# Patient Record
Sex: Female | Born: 1935 | Race: White | Hispanic: No | Smoking: Former smoker
Health system: Southern US, Community
[De-identification: ages and names within clinical notes are randomized; demographics above are authoritative.]

## PROBLEM LIST (undated history)

## (undated) DIAGNOSIS — M069 Rheumatoid arthritis, unspecified: Secondary | ICD-10-CM

## (undated) DIAGNOSIS — C801 Malignant (primary) neoplasm, unspecified: Secondary | ICD-10-CM

## (undated) DIAGNOSIS — Z87898 Personal history of other specified conditions: Secondary | ICD-10-CM

## (undated) DIAGNOSIS — K219 Gastro-esophageal reflux disease without esophagitis: Secondary | ICD-10-CM

## (undated) DIAGNOSIS — J069 Acute upper respiratory infection, unspecified: Secondary | ICD-10-CM

## (undated) DIAGNOSIS — Z5189 Encounter for other specified aftercare: Secondary | ICD-10-CM

## (undated) DIAGNOSIS — IMO0001 Reserved for inherently not codable concepts without codable children: Secondary | ICD-10-CM

## (undated) DIAGNOSIS — I10 Essential (primary) hypertension: Secondary | ICD-10-CM

## (undated) DIAGNOSIS — N3010 Interstitial cystitis (chronic) without hematuria: Secondary | ICD-10-CM

## (undated) DIAGNOSIS — R569 Unspecified convulsions: Secondary | ICD-10-CM

## (undated) DIAGNOSIS — K589 Irritable bowel syndrome without diarrhea: Secondary | ICD-10-CM

## (undated) DIAGNOSIS — J302 Other seasonal allergic rhinitis: Secondary | ICD-10-CM

## (undated) HISTORY — PX: CARDIAC CATHETERIZATION: SHX172

## (undated) HISTORY — PX: COLONOSCOPY: SHX174

## (undated) HISTORY — PX: CATARACT EXTRACTION, BILATERAL: SHX1313

## (undated) HISTORY — PX: TONGUE SURGERY: SHX810

## (undated) HISTORY — PX: FOOT SURGERY: SHX648

## (undated) HISTORY — PX: APPENDECTOMY: SHX54

## (undated) HISTORY — PX: HIP PINNING: SHX1757

## (undated) HISTORY — PX: HIP ARTHROPLASTY: SHX981

## (undated) HISTORY — PX: BREAST SURGERY: SHX581

---

## 1972-11-10 HISTORY — PX: ABDOMINAL HYSTERECTOMY: SHX81

## 2011-04-09 ENCOUNTER — Encounter (HOSPITAL_COMMUNITY)
Admission: RE | Admit: 2011-04-09 | Discharge: 2011-04-09 | Disposition: A | Discharge status: Home / Self Care | Payer: Medicare Other | Source: Ambulatory Visit | Attending: Otolaryngology | Admitting: Otolaryngology

## 2011-04-09 ENCOUNTER — Other Ambulatory Visit (HOSPITAL_COMMUNITY): Payer: Self-pay | Admitting: Otolaryngology

## 2011-04-09 DIAGNOSIS — C060 Malignant neoplasm of cheek mucosa: Secondary | ICD-10-CM

## 2011-04-09 LAB — URINALYSIS, ROUTINE W REFLEX MICROSCOPIC
Bilirubin Urine: NEGATIVE
Glucose, UA: NEGATIVE mg/dL
Hgb urine dipstick: NEGATIVE
Ketones, ur: 15 mg/dL — AB
Nitrite: NEGATIVE
Protein, ur: NEGATIVE mg/dL
Specific Gravity, Urine: 1.012 (ref 1.005–1.030)
Urobilinogen, UA: 0.2 mg/dL (ref 0.0–1.0)
pH: 7.5 (ref 5.0–8.0)

## 2011-04-09 LAB — SURGICAL PCR SCREEN
MRSA, PCR: NEGATIVE
Staphylococcus aureus: NEGATIVE

## 2011-04-09 LAB — ELECTROLYTE PANEL
CO2: 29 meq/L (ref 19–32)
Chloride: 95 mEq/L — ABNORMAL LOW (ref 96–112)
Potassium: 4.1 meq/L (ref 3.5–5.1)
Sodium: 133 meq/L — ABNORMAL LOW (ref 135–145)

## 2011-04-09 LAB — CBC
HCT: 39.6 % (ref 36.0–46.0)
Hemoglobin: 13.8 g/dL (ref 12.0–15.0)
MCH: 32.1 pg (ref 26.0–34.0)
MCHC: 34.8 g/dL (ref 30.0–36.0)
MCV: 92.1 fL (ref 78.0–100.0)
Platelets: 316 10*3/uL (ref 150–400)
RBC: 4.3 MIL/uL (ref 3.87–5.11)
RDW: 13.8 % (ref 11.5–15.5)
WBC: 6.9 10*3/uL (ref 4.0–10.5)

## 2011-04-09 LAB — URINE MICROSCOPIC-ADD ON

## 2011-04-10 ENCOUNTER — Ambulatory Visit (HOSPITAL_COMMUNITY)
Admission: RE | Admit: 2011-04-10 | Discharge: 2011-04-10 | Disposition: A | Discharge status: Home / Self Care | Payer: Medicare Other | Source: Ambulatory Visit | Attending: Otolaryngology | Admitting: Otolaryngology

## 2011-04-10 ENCOUNTER — Other Ambulatory Visit (HOSPITAL_COMMUNITY): Payer: Self-pay | Admitting: Otolaryngology

## 2011-04-10 DIAGNOSIS — Z01818 Encounter for other preprocedural examination: Secondary | ICD-10-CM | POA: Insufficient documentation

## 2011-04-10 DIAGNOSIS — M47812 Spondylosis without myelopathy or radiculopathy, cervical region: Secondary | ICD-10-CM | POA: Insufficient documentation

## 2011-04-10 DIAGNOSIS — M5020 Other cervical disc displacement, unspecified cervical region: Secondary | ICD-10-CM | POA: Insufficient documentation

## 2011-04-10 LAB — BASIC METABOLIC PANEL
Chloride: 94 mEq/L — ABNORMAL LOW (ref 96–112)
Potassium: 4.3 mEq/L (ref 3.5–5.1)

## 2011-04-10 LAB — BASIC METABOLIC PANEL WITH GFR
BUN: 10 mg/dL (ref 6–23)
CO2: 30 meq/L (ref 19–32)
Calcium: 9.4 mg/dL (ref 8.4–10.5)
Creatinine, Ser: 0.57 mg/dL (ref 0.4–1.2)
GFR calc Af Amer: >60 mL/min (ref >60)
GFR calc non Af Amer: >60 mL/min (ref >60)
Glucose, Bld: 85 mg/dL (ref 70–99)
Sodium: 132 meq/L — ABNORMAL LOW (ref 135–145)

## 2011-04-11 ENCOUNTER — Observation Stay (HOSPITAL_COMMUNITY)
Admission: RE | Admit: 2011-04-11 | Discharge: 2011-04-12 | Disposition: A | Discharge status: Home / Self Care | Payer: Medicare Other | Source: Ambulatory Visit | Attending: Otolaryngology | Admitting: Otolaryngology

## 2011-04-11 ENCOUNTER — Other Ambulatory Visit: Payer: Self-pay | Admitting: Otolaryngology

## 2011-04-11 DIAGNOSIS — C029 Malignant neoplasm of tongue, unspecified: Principal | ICD-10-CM | POA: Insufficient documentation

## 2011-04-11 DIAGNOSIS — C069 Malignant neoplasm of mouth, unspecified: Secondary | ICD-10-CM | POA: Insufficient documentation

## 2011-04-11 DIAGNOSIS — I10 Essential (primary) hypertension: Secondary | ICD-10-CM | POA: Insufficient documentation

## 2011-04-11 DIAGNOSIS — M069 Rheumatoid arthritis, unspecified: Secondary | ICD-10-CM | POA: Insufficient documentation

## 2011-04-17 NOTE — Discharge Summary (Signed)
NAMEBRANDY, [name]              ACCOUNT NO.:  [id number]  MEDICAL RECORD NO.:  [id number]           PATIENT TYPE:  O  LOCATION:  5156                         FACILITY:  [facility]  PHYSICIAN:  Hermelinda Medicus, M.D.   DATE OF BIRTH:  09/12/1931  DATE OF ADMISSION:  04/11/2011 DATE OF DISCHARGE:                              DISCHARGE SUMMARY   HISTORY OF PRESENT ILLNESS:  This patient is a 77 year old female who has had previous surgery at [facility] where she has had a malignancy of her tongue resected.  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 Manson Passey for reevaluation of her right tongue and buccal mucous membrane which was a large angry looking leukoplakia.  The area not only appeared to be leukoplakic but also had a very heavy erythematous area on the right buccal membrane.  She has dental scars with her history where she had surgery apparently on 2003, 2005, and 2008, and she now enters for resection of that buccal mucous membrane with frozen section and also a biopsy excision of her tongue area considering her past history.  PAST HISTORY:  Is also one of allergy to DILANTIN and DOXYCYCLINE.  She also has had a hysterectomy, has had hip surgery for fracture in 1990, breast lumpectomy, right foot surgery.  She has had pins in her toes. She has rheumatoid arthritis long-term care in 1998 and she had an ORIF of her hip.  She has had cataracts removed.  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  Cervical spine evaluation was noted to be mildly unstable. Anesthesia felt that a glide scope could be used if she needed intubation.  SOCIAL HISTORY:  She never smoked, drink occasionally, has some dental work that she has done.  CARDIOVASCULAR HISTORY:   Is showing some hypertension that she is under control.  Stress test was done way back in 2000 and she was found to be in good status but her EKG showed no history of any myocardial infarction.  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  She also has urinary tract infection, hiatal hernia and is on the rheumatoid arthritis Imuran half a tablet 15 mg tablet and also Humira 40 mg subcutaneously every 2 weeks.  PHYSICAL EXAMINATION:  VITAL SIGNS:  A blood pressure 123/82, pulse 85, respirations 18, oxygen under room air 95.  She is only 4 feet 6 inches tall, her weight is 45 kg. HEENT:  Tympanic membranes looked excellent and move well.  Her oral cavity shows this large 2 x 3 buccal membrane on the right side that is very angry appearing and also appears to be an angry looking leukoplakia, possible carcinoma in situ, possible squamous cell carcinoma, invasive.  She also had had some dental work done and does have a mixture of gold and silver in some quite traumatic teeth within her  mouth.  She also has had considerable scarring of her tongue but she was operated on at [facility]  Larynx is clear to false cords. Epiglottis and base of tongue are clear.  The true cord mobility, gag reflex, tongue mobility, EOMs, facial nerve are all symmetrical. NECK:  Free of any thyromegaly, cervical adenopathy or mass. BACK:  CAT scan done showed the unstable cervical spine which showed no abnormal cervical adenopathy.  She also had extension and flexion films done to evaluate the stability of her cervical spine and were found to be somewhat flexible but not unstable. HEART:  A regular sinus rhythm.  No history of myocardial infarction. CHEST:  AP diameter increased but her chest x-ray showed no active disease.  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  LABORATORIES:  Quite excellent and she shows a white count of 6.9, hemoglobin 13.8, 39.6 hematocrit.  Electrolytes were looking quite excellent.  Staph aureus was negative.  MRSA was negative.  The surgery went very well under local MAC anesthesia.  We biopsy excised the right tongue and this was found to be chronic inflammation. The  biopsy excision of the entire right buccal mucous membrane was biopsied and they did not show any squamous cell carcinoma invasive. They felt it was leukoplakia though the frozen section they were not going to give any final diagnosis at that point, however, she did very well.  We closed this with 3-0 chromic catgut and the patient because of her severe rheumatoid arthritis and her unstable spine and her inability to get around accepted the walker.  She was kept on a 23-hour observation and will be discharged today under the care of her daughter. She will follow up with me in 6 days and we will check pathology on her permanent sections.          ______________________________ Hermelinda Medicus, M.D.     JC/MEDQ  D:  04/12/2011  T:  04/12/2011  Job:  [id number]  cc:   Hal T. Stoneking, M.D. Grant Ruts., D.D.S.  Electronically Signed by Hermelinda Medicus M.D. on 04/17/2011 02:23:40 PM

## 2011-04-17 NOTE — Op Note (Signed)
  [name], LEEB              ACCOUNT NO.:  [id number]  MEDICAL RECORD NO.:  [id number]  LOCATION:                                 FACILITY:  PHYSICIAN:  Hermelinda Medicus, M.D.   DATE OF BIRTH:  Mar 08, 1931  DATE OF PROCEDURE: DATE OF DISCHARGE:                              OPERATIVE REPORT   PREOPERATIVE DIAGNOSES:  Right leukoplakia buccal mucous membrane, 2 x 3 cm with history of leukoplakia, rule out squamous cell carcinoma and right resection of previously resected tongue with lesion with concern at the incisional site.  POSTOPERATIVE DIAGNOSES:  Right leukoplakia buccal mucous membrane, 2 x 3 cm with history of leukoplakia, rule out squamous cell carcinoma and right resection of previously resected tongue with lesion with concern at the incisional site.  SURGERY:  Resection of the buccal mucous membrane leukoplakia with frozen section which did not see invasive squamous cell carcinoma and the biopsy excision of the right tongue showing inflammation chronic.  OPERATOR:  Hermelinda Medicus, MD  ANESTHESIA:  Local, MAC with Dr. Jacklynn Bue, general anesthesia was avoided because of an unstable cervical spine, because of rheumatoid arthritis.  The patient is aware of the risks and gains that she is going to have to have a bland diet and be careful with this food until this buccal mucous membrane heals.  The patient under local MAC anesthesia was anesthetized with 1% Xylocaine with epinephrine and then the tongue biopsy was carried out and frozen section was reviewed showing only a chronic inflammation. The frozen section was also as stated done at the area of the buckle membrane leukoplakia squamous cell and they saw inflammation with leukoplakia, but did not see invasive squamous cell carcinoma, so therefore this entire leukoplakia was excised using the blade excision and then Bovie electrocoagulation was used for hemostasis.  This area was closed carefully with 3-0  chromic catgut interrupted sutures.  The area of resection was essentially 3 cm x 2 cm in size and this will be sent of course to Pathology for permanent section.  The patient tolerated the procedure very well.  She lost approximately 20 mL of blood, had no pain, and is doing well and because of her rheumatoid arthritis and difficulty getting around we will probably keep her as a 23-hour observation.          ______________________________ Hermelinda Medicus, M.D.     JC/MEDQ  D:  04/11/2011  T:  04/11/2011  Job:  [id number]  cc:   Grant Ruts., D.D.S. Dr. Elray Buba Hal T. Stoneking, M.D.  Electronically Signed by Hermelinda Medicus M.D. on 04/17/2011 02:23:43 PM

## 2011-04-17 NOTE — H&P (Signed)
NAME:  [name], [name]              ACCOUNT NO.:  [id number]  MEDICAL RECORD NO.:  [id number]  LOCATION:                                 FACILITY:  PHYSICIAN:  Hermelinda Medicus, M.D.   DATE OF BIRTH:  1931-08-31  DATE OF ADMISSION:  04/11/2011 DATE OF DISCHARGE:                             HISTORY & PHYSICAL   This patient is a 77 year old female who has lived in [place], [place] in the past.  She has had 3 resections of lesion, squamous cell carcinoma from her tongue on the right side.  Apparently, these have been very small because she has done extremely well.  This was done at [facility] in 2003, 2005, and 2008.  She also has been treated for a leukoplakia in her right buccal mucous membrane in 1999.  She moved here from [facility] and has had dental care and was referred to me by Dr. Gwendlyn Deutscher. She has a lesion that is approximately 3 x 2 cm in her right buccal mucous membrane which does look like leukoplakia in certain parts, but also looks like a carcinoma in situ and maybe invasive carcinoma.  She also has a small lesion in her right tongue which shows it is associated with some of the scar tissue from previous resection which is of concern.  She now enters for an excisional biopsy and frozen section and then resection of these areas based on the frozen section results.  On her past history, she is and allergic to DILANTIN and DOXYCYCLINE. She has had a hysterectomy.  She had a right hip surgery for fracture in 1990, had a breast lumpectomy, had a right foot surgery with pins in her toes.  She has rheumatoid arthritis long term in 1998.  She had the what they call precancerous lesions also removed from her tongue.  She had an ORIF of her left hip and has had cataracts removed with laser surgery apparently after that.  She also because of her rheumatoid arthritis has UNSTABLE CERVICAL SPINE, concern of intubation, is worked up with a CAT scan not only because of her lesions  which were totally negative for any abnormal nodes or spread of tumor but did show the cervical spine instability and this was checked with also flexion and extension cervical spine evaluation and found to be mildly unstable, but if necessary could using a GlideScope intubate.  Therefore, our plan is to do the surgery under local MAC.  She never smoked.  She drinks occasionally.  She has some caps and crowns.  Her cardiovascular history, she does have some hypertension well under control, stress test done in 2000.  She has her EKG, is regular sinus rhythm, left axis deviation.  No history of myocardial infarction.  She also had a grand mal seizure as an infant and does take medication for this, hiatal hernia. Urinary history, urinary tract infections with severe rheumatoid arthritis and she takes Imuran 1/2 tablet of 15 mg daily and also takes Humira 0.8/40 mg subcutaneously every 2 weeks.  She takes several other medications that are in the chart.  Her blood pressure is well under control at 123/82, pulse 85, respirations 18, O2 95.  She  is 4 feet and 6 inches tall.  Weight 45. Her ears are clear.  Tympanic membranes look excellent.  Her oral cavity shows this large buccal membrane leukoplakia, squamous cell carcinoma in situ possibility, 3 x 2 cm, mid buccal membrane.  She has also had some dental work in the past and does have mixed gold and silver.  She has also very sharp teeth.  She also has the tongue that is quite scarred from previous resections at [facility] and has one area of concern which will be biopsied.  Larynx is clear.  True cords, false cords, epiglottis, base of tongue, and lateral pharyngeal walls are completely clear.  True cord mobility, gag reflex, tongue mobility, EOMs, and facial nerve are all symmetrical.  Her tongue mobility is limited because of scar tissue, but there does not appear to be any neurologic issue.  Oriented x3.  Cranial nerves intact.  Neck is  somewhat unstable. She has considerable pain and instability seen on CAT scan and because of her rheumatoid arthritis.  Her chest is clear.  No rales, rhonchi, or wheezes.  Increased AP diameter, regular sinus rhythm on her cardiac evaluation with left axis deviation.  GI is within normal status.  Her legs are showing history of her arthritis as well as her hands and history of her surgery as above mentioned.  Our initial diagnosis is that of leukoplakia right buccal membrane, rule out squamous cell carcinoma in situ or invasive, history of squamous cell carcinoma of the tongue, rule out recurrent issue in the right tongue, rheumatoid arthritis, history of breast cancer, history of hip fracture, history of seizures, history of right foot surgery, and right hip fracture, history of hysterectomy.          ______________________________ Hermelinda Medicus, M.D.     JC/MEDQ  D:  04/11/2011  T:  04/11/2011  Job:  [id number]  cc:   Grant Ruts., D.D.S. Hal T. Stoneking, M.D. Dr. Elray Buba  Electronically Signed by Hermelinda Medicus M.D. on 04/17/2011 02:23:46 PM

## 2011-09-17 ENCOUNTER — Other Ambulatory Visit: Payer: Self-pay

## 2011-11-20 DIAGNOSIS — D3701 Neoplasm of uncertain behavior of lip: Secondary | ICD-10-CM | POA: Diagnosis not present

## 2011-11-20 DIAGNOSIS — K1321 Leukoplakia of oral mucosa, including tongue: Secondary | ICD-10-CM | POA: Diagnosis not present

## 2011-12-08 DIAGNOSIS — M25559 Pain in unspecified hip: Secondary | ICD-10-CM | POA: Diagnosis not present

## 2011-12-08 DIAGNOSIS — M810 Age-related osteoporosis without current pathological fracture: Secondary | ICD-10-CM | POA: Diagnosis not present

## 2011-12-08 DIAGNOSIS — M069 Rheumatoid arthritis, unspecified: Secondary | ICD-10-CM | POA: Diagnosis not present

## 2011-12-08 DIAGNOSIS — M2550 Pain in unspecified joint: Secondary | ICD-10-CM | POA: Diagnosis not present

## 2011-12-08 DIAGNOSIS — Z79899 Other long term (current) drug therapy: Secondary | ICD-10-CM | POA: Diagnosis not present

## 2011-12-11 DIAGNOSIS — S72109A Unspecified trochanteric fracture of unspecified femur, initial encounter for closed fracture: Secondary | ICD-10-CM | POA: Diagnosis not present

## 2012-01-22 DIAGNOSIS — H52209 Unspecified astigmatism, unspecified eye: Secondary | ICD-10-CM | POA: Diagnosis not present

## 2012-01-22 DIAGNOSIS — H04129 Dry eye syndrome of unspecified lacrimal gland: Secondary | ICD-10-CM | POA: Diagnosis not present

## 2012-01-22 DIAGNOSIS — Z961 Presence of intraocular lens: Secondary | ICD-10-CM | POA: Diagnosis not present

## 2012-01-22 DIAGNOSIS — H01009 Unspecified blepharitis unspecified eye, unspecified eyelid: Secondary | ICD-10-CM | POA: Diagnosis not present

## 2012-02-02 DIAGNOSIS — N3020 Other chronic cystitis without hematuria: Secondary | ICD-10-CM | POA: Diagnosis not present

## 2012-02-19 DIAGNOSIS — M2550 Pain in unspecified joint: Secondary | ICD-10-CM | POA: Diagnosis not present

## 2012-02-19 DIAGNOSIS — M069 Rheumatoid arthritis, unspecified: Secondary | ICD-10-CM | POA: Diagnosis not present

## 2012-02-19 DIAGNOSIS — Z79899 Other long term (current) drug therapy: Secondary | ICD-10-CM | POA: Diagnosis not present

## 2012-02-19 DIAGNOSIS — M810 Age-related osteoporosis without current pathological fracture: Secondary | ICD-10-CM | POA: Diagnosis not present

## 2012-02-25 DIAGNOSIS — L570 Actinic keratosis: Secondary | ICD-10-CM | POA: Diagnosis not present

## 2012-02-25 DIAGNOSIS — Z85828 Personal history of other malignant neoplasm of skin: Secondary | ICD-10-CM | POA: Diagnosis not present

## 2012-02-25 DIAGNOSIS — D485 Neoplasm of uncertain behavior of skin: Secondary | ICD-10-CM | POA: Diagnosis not present

## 2012-03-04 DIAGNOSIS — C049 Malignant neoplasm of floor of mouth, unspecified: Secondary | ICD-10-CM | POA: Diagnosis not present

## 2012-03-04 DIAGNOSIS — K140 Glossitis: Secondary | ICD-10-CM | POA: Diagnosis not present

## 2012-03-04 DIAGNOSIS — K1321 Leukoplakia of oral mucosa, including tongue: Secondary | ICD-10-CM | POA: Diagnosis not present

## 2012-03-10 ENCOUNTER — Encounter (HOSPITAL_COMMUNITY): Payer: Self-pay | Admitting: Pharmacy Technician

## 2012-03-10 DIAGNOSIS — D0000 Carcinoma in situ of oral cavity, unspecified site: Secondary | ICD-10-CM | POA: Diagnosis not present

## 2012-03-15 ENCOUNTER — Encounter (HOSPITAL_COMMUNITY)
Admission: RE | Admit: 2012-03-15 | Discharge: 2012-03-15 | Disposition: A | Discharge status: Home / Self Care | Payer: Medicare Other | Source: Ambulatory Visit | Attending: Otolaryngology | Admitting: Otolaryngology

## 2012-03-15 ENCOUNTER — Encounter (HOSPITAL_COMMUNITY): Payer: Self-pay

## 2012-03-15 DIAGNOSIS — I10 Essential (primary) hypertension: Secondary | ICD-10-CM | POA: Diagnosis not present

## 2012-03-15 DIAGNOSIS — K219 Gastro-esophageal reflux disease without esophagitis: Secondary | ICD-10-CM | POA: Diagnosis not present

## 2012-03-15 DIAGNOSIS — Z01812 Encounter for preprocedural laboratory examination: Secondary | ICD-10-CM | POA: Diagnosis not present

## 2012-03-15 DIAGNOSIS — G40802 Other epilepsy, not intractable, without status epilepticus: Secondary | ICD-10-CM | POA: Diagnosis not present

## 2012-03-15 DIAGNOSIS — C049 Malignant neoplasm of floor of mouth, unspecified: Secondary | ICD-10-CM | POA: Diagnosis not present

## 2012-03-15 HISTORY — DX: Personal history of other specified conditions: Z87.898

## 2012-03-15 HISTORY — DX: Acute upper respiratory infection, unspecified: J06.9

## 2012-03-15 HISTORY — DX: Gastro-esophageal reflux disease without esophagitis: K21.9

## 2012-03-15 HISTORY — DX: Unspecified convulsions: R56.9

## 2012-03-15 HISTORY — DX: Irritable bowel syndrome, unspecified: K58.9

## 2012-03-15 HISTORY — DX: Essential (primary) hypertension: I10

## 2012-03-15 HISTORY — DX: Rheumatoid arthritis, unspecified: M06.9

## 2012-03-15 HISTORY — DX: Malignant (primary) neoplasm, unspecified: C80.1

## 2012-03-15 LAB — DIFFERENTIAL
Basophils Absolute: 0 10*3/uL (ref 0.0–0.1)
Basophils Relative: 1 % (ref 0–1)
Eosinophils Absolute: 0.2 K/uL (ref 0.0–0.7)
Eosinophils Relative: 3 % (ref 0–5)
Lymphocytes Relative: 32 % (ref 12–46)
Lymphs Abs: 1.9 K/uL (ref 0.7–4.0)
Monocytes Absolute: 0.7 10*3/uL (ref 0.1–1.0)
Monocytes Relative: 11 % (ref 3–12)
Neutro Abs: 3.1 K/uL (ref 1.7–7.7)
Neutrophils Relative %: 53 % (ref 43–77)

## 2012-03-15 LAB — BASIC METABOLIC PANEL WITH GFR
Chloride: 96 meq/L (ref 96–112)
GFR calc non Af Amer: 81 mL/min — ABNORMAL LOW (ref >90)
Glucose, Bld: 85 mg/dL (ref 70–99)
Potassium: 4.6 meq/L (ref 3.5–5.1)
Sodium: 134 meq/L — ABNORMAL LOW (ref 135–145)

## 2012-03-15 LAB — CBC
HCT: 36.6 % (ref 36.0–46.0)
Hemoglobin: 12.7 g/dL (ref 12.0–15.0)
MCH: 31.9 pg (ref 26.0–34.0)
MCHC: 34.7 g/dL (ref 30.0–36.0)
MCV: 92 fL (ref 78.0–100.0)
Platelets: 318 K/uL (ref 150–400)
RBC: 3.98 MIL/uL (ref 3.87–5.11)
RDW: 14 % (ref 11.5–15.5)
WBC: 5.9 K/uL (ref 4.0–10.5)

## 2012-03-15 LAB — BASIC METABOLIC PANEL
BUN: 17 mg/dL (ref 6–23)
CO2: 26 mEq/L (ref 19–32)
Calcium: 9.6 mg/dL (ref 8.4–10.5)
Creatinine, Ser: 0.67 mg/dL (ref 0.50–1.10)
GFR calc Af Amer: >90 mL/min (ref >90)

## 2012-03-15 LAB — SURGICAL PCR SCREEN
MRSA, PCR: NEGATIVE
Staphylococcus aureus: NEGATIVE

## 2012-03-15 NOTE — Progress Notes (Signed)
Pt confirmed to having a stress test and cardiac cath in 2000 in [place]. Pt denied having any stents placed or having a current Cardiologist. Pt denied having a sleep study. Patients PCP is Dr. Pete Glatter at [place] [facility] and LOV was requested from them.

## 2012-03-15 NOTE — Pre-Procedure Instructions (Signed)
20 AZIZI BALLY  03/15/2012   Your procedure is scheduled on:  Wednesday Mar 17, 2012.  Report to [facility] at 0630 AM.  Call this number if you have problems the morning of surgery: [phone]   Remember:   Do not eat food:After Midnight.  May have clear liquids: up to 4 Hours before arrival until 0230 am.  Clear liquids include soda, tea, black coffee, apple or grape juice, broth.  Take these medicines the morning of surgery with A SIP OF WATER: Amlodipine (Norvac), Azathioprine (Imuran), and Phenobarbital (Luminal)   Do not wear jewelry, make-up or nail polish.  Do not wear lotions, powders, or perfumes. You may wear deodorant.  Do not shave 48 hours prior to surgery.  Do not bring valuables to the hospital.  Contacts, dentures or bridgework may not be worn into surgery.  Leave suitcase in the car. After surgery it may be brought to your room.  For patients admitted to the hospital, checkout time is 11:00 AM the day of discharge.   Patients discharged the day of surgery will not be allowed to drive home.  Name and phone number of your driver:   Special Instructions: CHG Shower Use Special Wash: 1/2 bottle night before surgery and 1/2 bottle morning of surgery.   Please read over the following fact sheets that you were given: Pain Booklet, Coughing and Deep Breathing, MRSA Information and Surgical Site Infection Prevention

## 2012-03-16 NOTE — H&P (Signed)
NAMEABRAHAM, [name]              ACCOUNT NO.:  [id number]  MEDICAL RECORD NO.:  [id number]  LOCATION:  PERIO                        FACILITY:  [facility]  PHYSICIAN:  Hermelinda Medicus, M.D.   DATE OF BIRTH:  10-24-1931  DATE OF ADMISSION:  03/10/2012 DATE OF DISCHARGE:                             HISTORY & PHYSICAL   This patient is a 78 year old female who has lived in [place] for several years.  Has had 3 resections of lesions of squamous cell carcinoma of her tongue on the right side at [facility].  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  She moved here from [place], referred from Dr. Gwendlyn Deutscher, and we removed a 3 x 2 cm buccal mucous membrane lesion as well as a squamous carcinoma in situ from her tongue on the right side.  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  She now enters for further evaluation and excision of this under local MAC anesthesia.  This is going to be done on local MAC anesthesia because she has a very fragile neck and we are concerned about her being intubated and under general endotracheal anesthesia.  She has an unstable cervical spine.  Her further past history is allergy to DILANTIN and DOXYCYCLINE.  She has gotten a hysterectomy.  She has had a right hip surgery for fracture in 1990, gotten a breast lumpectomy, right foot surgery with pins in her toes, has rheumatoid arthritis since 1998, and she has had these previous precancerous and cancerous lesions removed from the oral cavity.  She has also had ORIF of her hip and has had cataracts removed.  She does have  the rheumatoid arthritis, has an unstable cervical spine.  Concern on intubation was worked up with a CAT scan because she had no abnormal neck nodes, but this was noted the cervical spine instability.  There is a possibility of using a glide scope to intubate, but I think we can avoid this, so our surgery is to do under MAC.  She has never smoked. She has occasional drink.  She has several caps and crowns. Cardiovascular history was one of mild hypertension, under control. Stress test done in 2000.  Her EKG was regular sinus rhythm, left axis deviation.  No history of myocardial infarction.  She also had a history of a grand mal seizure as a child, but none since then.  Has urinary tract infection history.  She is on Imuran one-half tablet of 50 mg orally.  She also takes Humira 0.8 of 40 mg subcutaneous, lisinopril 10 mg once a day, phenobarbital 1 tablet twice a day, and then she is on L- carnitine, magnesium 1200 mg, selenium, vitamin E 50/400, glucosamine 500 mg, hyaluronic acid 20/60 mg orally daily, vitamin C 800, flaxseed oil,  milk thistle 175, folic acid 800, calcium 500, vitamin D3 300 units, and probiotic daily.  PHYSICAL EXAMINATION:  VITAL SIGNS:  Reveals a blood pressure of 120/80, pulse is 85, SaO2 of 95.  She is 4 feet 6 inches tall, weight is 45 kilos. HEENT:  Her ears are clear.  Tympanic membranes look excellent.  Her oral cavity shows the buccal mucous membrane area that has been repaired and has healed very nicely under my care.  Her lesion on the tongue which was biopsied on the right side.  Mid tongue was atypia only, but the basal anterior floor of the mouth was noted to have a squamous cell carcinoma, 2 x 1 cm in size.  She has no other lesions.  Her larynx is clear.  True cords, false cords, epiglottis, base of tongue, lateral pharyngeal walls are completely clear and true cord mobility, gag reflex, tongue mobility, EOMs, facial nerve is symmetrical.  She  is oriented x3. NECK:  Free of any thyromegaly, cervical adenopathy, or mass. CHEST:  Clear.  No rales, rhonchi, or wheezes. CARDIOVASCULAR:  No murmurs or gallops. ABDOMEN:  Unremarkable. EXTREMITIES:  Her legs are showing history of her arthritis as well as her hands.  She has a history of surgery as above mentioned.  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  She has an atypia of the right.  History of buccal mucous membrane atypia with an invasive, well-differentiated squamous cell carcinoma of the right tongue, under excellent control.  Further biopsy of the tongue was showing high-grade dysplasia.  Her further diagnoses are rheumatoid arthritis, history of breast cancer, history of hip fracture, history of seizures, history of right foot surgery and right hip fracture, and history of hysterectomy.          ______________________________ Hermelinda Medicus, M.D.     JC/MEDQ  D:  03/16/2012  T:  03/16/2012  Job:  [id number]  cc:   Hal T. Stoneking, M.D. Elray Buba, DDS Grant Ruts., D.D.S.

## 2012-03-17 ENCOUNTER — Encounter (HOSPITAL_COMMUNITY): Payer: Self-pay | Admitting: Anesthesiology

## 2012-03-17 ENCOUNTER — Encounter (HOSPITAL_COMMUNITY)
Admission: RE | Disposition: A | Discharge status: Home / Self Care | Payer: Self-pay | Source: Ambulatory Visit | Attending: Otolaryngology

## 2012-03-17 ENCOUNTER — Encounter (HOSPITAL_COMMUNITY): Payer: Self-pay | Admitting: *Deleted

## 2012-03-17 ENCOUNTER — Ambulatory Visit (HOSPITAL_COMMUNITY): Payer: Medicare Other | Admitting: Anesthesiology

## 2012-03-17 ENCOUNTER — Ambulatory Visit (HOSPITAL_COMMUNITY)
Admission: RE | Admit: 2012-03-17 | Discharge: 2012-03-18 | Disposition: A | Discharge status: Home / Self Care | Payer: Medicare Other | Source: Ambulatory Visit | Attending: Otolaryngology | Admitting: Otolaryngology

## 2012-03-17 DIAGNOSIS — I10 Essential (primary) hypertension: Secondary | ICD-10-CM | POA: Insufficient documentation

## 2012-03-17 DIAGNOSIS — K219 Gastro-esophageal reflux disease without esophagitis: Secondary | ICD-10-CM | POA: Insufficient documentation

## 2012-03-17 DIAGNOSIS — C049 Malignant neoplasm of floor of mouth, unspecified: Secondary | ICD-10-CM | POA: Insufficient documentation

## 2012-03-17 DIAGNOSIS — G40802 Other epilepsy, not intractable, without status epilepticus: Secondary | ICD-10-CM | POA: Insufficient documentation

## 2012-03-17 DIAGNOSIS — Z01812 Encounter for preprocedural laboratory examination: Secondary | ICD-10-CM | POA: Insufficient documentation

## 2012-03-17 DIAGNOSIS — D0000 Carcinoma in situ of oral cavity, unspecified site: Secondary | ICD-10-CM | POA: Diagnosis not present

## 2012-03-17 HISTORY — PX: SUBMANDIBULAR GLAND EXCISION: SHX2456

## 2012-03-17 SURGERY — EXCISION, SUBMANDIBULAR GLAND
Anesthesia: Monitor Anesthesia Care | Site: Mouth | Wound class: Clean Contaminated

## 2012-03-17 MED ORDER — LIDOCAINE-EPINEPHRINE 1 %-1:100000 IJ SOLN
INTRAMUSCULAR | Status: DC | PRN
  Administered 2012-03-17: 20 mL

## 2012-03-17 MED ORDER — CEFAZOLIN SODIUM 1-5 GM-% IV SOLN
INTRAVENOUS | Status: DC | PRN
  Administered 2012-03-17: 1 g via INTRAVENOUS

## 2012-03-17 MED ORDER — PHENOBARBITAL 32.4 MG PO TABS
64.8000 mg | ORAL_TABLET | Freq: Two times a day (BID) | ORAL | Status: DC
  Administered 2012-03-17 – 2012-03-18 (×2): 64.8 mg via ORAL
  Filled 2012-03-17 (×2): qty 2

## 2012-03-17 MED ORDER — AZATHIOPRINE 50 MG PO TABS
25.0000 mg | ORAL_TABLET | Freq: Every day | ORAL | Status: DC
  Administered 2012-03-18: 25 mg via ORAL
  Filled 2012-03-17 (×2): qty 1

## 2012-03-17 MED ORDER — HYDROCODONE-ACETAMINOPHEN 7.5-500 MG/15ML PO SOLN
10.0000 mL | ORAL | Status: DC | PRN
  Administered 2012-03-17 – 2012-03-18 (×4): 15 mL via ORAL
  Filled 2012-03-17 (×3): qty 15

## 2012-03-17 MED ORDER — AMLODIPINE BESYLATE 10 MG PO TABS
10.0000 mg | ORAL_TABLET | Freq: Every day | ORAL | Status: DC
  Administered 2012-03-17: 10 mg via ORAL
  Filled 2012-03-17 (×2): qty 1

## 2012-03-17 MED ORDER — PHENOBARBITAL 64.8 MG PO TABS: 64.8000 mg | ORAL_TABLET | Freq: Two times a day (BID) | ORAL | Status: DC

## 2012-03-17 MED ORDER — PHENOBARBITAL 64.8 MG PO TABS
64.8000 mg | ORAL_TABLET | ORAL | Status: AC
  Administered 2012-03-17: 64.8 mg via ORAL

## 2012-03-17 MED ORDER — ACETAMINOPHEN 650 MG RE SUPP: 650.0000 mg | RECTAL | Status: DC | PRN

## 2012-03-17 MED ORDER — FENTANYL CITRATE 0.05 MG/ML IJ SOLN
INTRAMUSCULAR | Status: DC | PRN
  Administered 2012-03-17: 50 ug via INTRAVENOUS

## 2012-03-17 MED ORDER — 0.9 % SODIUM CHLORIDE (POUR BTL) OPTIME
TOPICAL | Status: DC | PRN
  Administered 2012-03-17: 1000 mL

## 2012-03-17 MED ORDER — HYDROMORPHONE HCL PF 1 MG/ML IJ SOLN: 0.2500 mg | INTRAMUSCULAR | Status: DC | PRN

## 2012-03-17 MED ORDER — CEFAZOLIN SODIUM 1-5 GM-% IV SOLN
INTRAVENOUS | Status: AC
  Filled 2012-03-17: qty 50

## 2012-03-17 MED ORDER — LISINOPRIL 5 MG PO TABS
5.0000 mg | ORAL_TABLET | Freq: Every day | ORAL | Status: DC
Start: 2012-03-17 — End: 2012-03-18
  Administered 2012-03-17: 5 mg via ORAL
  Filled 2012-03-17 (×2): qty 1

## 2012-03-17 MED ORDER — ASPIRIN EC 81 MG PO TBEC
81.0000 mg | DELAYED_RELEASE_TABLET | Freq: Every day | ORAL | Status: DC
  Filled 2012-03-17: qty 1

## 2012-03-17 MED ORDER — LACTATED RINGERS IV SOLN: INTRAVENOUS | Status: DC

## 2012-03-17 MED ORDER — MORPHINE SULFATE 4 MG/ML IJ SOLN: 0.0500 mg/kg | INTRAMUSCULAR | Status: DC | PRN

## 2012-03-17 MED ORDER — ACETAMINOPHEN 160 MG/5ML PO SOLN: 650.0000 mg | ORAL | Status: DC | PRN

## 2012-03-17 MED ORDER — LACTATED RINGERS IV SOLN
INTRAVENOUS | Status: DC | PRN
  Administered 2012-03-17: 08:00:00 via INTRAVENOUS

## 2012-03-17 MED ORDER — DEXTROSE 5 % IV SOLN
INTRAVENOUS | Status: DC | PRN
  Administered 2012-03-17: 09:00:00 via INTRAVENOUS

## 2012-03-17 MED ORDER — MIDAZOLAM HCL 5 MG/5ML IJ SOLN
INTRAMUSCULAR | Status: DC | PRN
  Administered 2012-03-17: 2 mg via INTRAVENOUS

## 2012-03-17 MED ORDER — NITROFURANTOIN MACROCRYSTAL 50 MG PO CAPS
50.0000 mg | ORAL_CAPSULE | Freq: Every day | ORAL | Status: DC
  Administered 2012-03-17: 50 mg via ORAL
  Filled 2012-03-17 (×2): qty 1

## 2012-03-17 SURGICAL SUPPLY — 43 items
CANISTER SUCTION 2500CC (MISCELLANEOUS) ×2 IMPLANT
CLEANER TIP ELECTROSURG 2X2 (MISCELLANEOUS) ×2 IMPLANT
CLOTH BEACON ORANGE TIMEOUT ST (SAFETY) ×2 IMPLANT
CONT SPEC 4OZ CLIKSEAL STRL BL (MISCELLANEOUS) ×4 IMPLANT
CORDS BIPOLAR (ELECTRODE) IMPLANT
COVER SURGICAL LIGHT HANDLE (MISCELLANEOUS) ×2 IMPLANT
DRAIN SNY 7 FPER (WOUND CARE) IMPLANT
ELECT COATED BLADE 2.86 ST (ELECTRODE) ×2 IMPLANT
ELECT REM PT RETURN 9FT ADLT (ELECTROSURGICAL) ×2
ELECTRODE REM PT RTRN 9FT ADLT (ELECTROSURGICAL) ×1 IMPLANT
EVACUATOR SILICONE 100CC (DRAIN) IMPLANT
GAUZE SPONGE 4X4 16PLY XRAY LF (GAUZE/BANDAGES/DRESSINGS) IMPLANT
GLOVE BIOGEL PI IND STRL 6.5 (GLOVE) ×2 IMPLANT
GLOVE BIOGEL PI INDICATOR 6.5 (GLOVE) ×2
GLOVE SURG SS PI 7.5 STRL IVOR (GLOVE) ×2 IMPLANT
GOWN PREVENTION PLUS XLARGE (GOWN DISPOSABLE) ×2 IMPLANT
GOWN STRL NON-REIN LRG LVL3 (GOWN DISPOSABLE) ×4 IMPLANT
KIT BASIN OR (CUSTOM PROCEDURE TRAY) ×2 IMPLANT
KIT ROOM TURNOVER OR (KITS) ×2 IMPLANT
LOCATOR NERVE 3 VOLT (DISPOSABLE) IMPLANT
NS IRRIG 1000ML POUR BTL (IV SOLUTION) ×2 IMPLANT
PAD ARMBOARD 7.5X6 YLW CONV (MISCELLANEOUS) ×2 IMPLANT
PENCIL BUTTON HOLSTER BLD 10FT (ELECTRODE) ×2 IMPLANT
SPECIMEN JAR MEDIUM (MISCELLANEOUS) ×2 IMPLANT
SPONGE INTESTINAL PEANUT (DISPOSABLE) IMPLANT
STRIP CLOSURE SKIN 1/2X4 (GAUZE/BANDAGES/DRESSINGS) IMPLANT
SUT CHROMIC 3 0 SH 27 (SUTURE) ×2 IMPLANT
SUT CHROMIC 5 0 P 3 (SUTURE) ×2 IMPLANT
SUT ETHILON 5 0 P 3 18 (SUTURE)
SUT NYLON ETHILON 5-0 P-3 1X18 (SUTURE) IMPLANT
SUT SILK 2 0 (SUTURE) ×1
SUT SILK 2 0 FS (SUTURE) ×2 IMPLANT
SUT SILK 2 0 SH CR/8 (SUTURE) IMPLANT
SUT SILK 2-0 18XBRD TIE 12 (SUTURE) ×1 IMPLANT
SUT SILK 3 0 (SUTURE)
SUT SILK 3-0 18XBRD TIE 12 (SUTURE) IMPLANT
SUT SILK 4 0 (SUTURE)
SUT SILK 4-0 18XBRD TIE 12 (SUTURE) IMPLANT
SYR CONTROL 10ML LL (SYRINGE) ×2 IMPLANT
TOWEL OR 17X24 6PK STRL BLUE (TOWEL DISPOSABLE) ×2 IMPLANT
TOWEL OR 17X26 10 PK STRL BLUE (TOWEL DISPOSABLE) ×2 IMPLANT
TRAY ENT MC OR (CUSTOM PROCEDURE TRAY) ×2 IMPLANT
WATER STERILE IRR 1000ML POUR (IV SOLUTION) IMPLANT

## 2012-03-17 NOTE — Anesthesia Procedure Notes (Signed)
Procedure Name: MAC Date/Time: 03/17/2012 8:42 AM Performed by: Nicholos Johns Pre-anesthesia Checklist: Patient identified, Emergency Drugs available, Suction available, Patient being monitored and Timeout performed Patient Re-evaluated:Patient Re-evaluated prior to inductionOxygen Delivery Method: Nasal cannula

## 2012-03-17 NOTE — Preoperative (Signed)
Beta Blockers   Reason not to administer Beta Blockers:Not Applicable 

## 2012-03-17 NOTE — Anesthesia Preprocedure Evaluation (Addendum)
Anesthesia Evaluation  Patient identified by MRN, date of birth, ID band Patient awake    Reviewed: Allergy & Precautions, H&P , NPO status , Patient's Chart, lab work & pertinent test results  History of Anesthesia Complications (+) DIFFICULT AIRWAY and AWARENESS UNDER ANESTHESIA  Airway Mallampati: III TM Distance: <3 FB   Mouth opening: Limited Mouth Opening  Dental  (+) Teeth Intact and Dental Advisory Given   Pulmonary Recent URI ,  breath sounds clear to auscultation        Cardiovascular hypertension, Pt. on medications Rhythm:Regular Rate:Normal     Neuro/Psych Seizures -, Well Controlled,     GI/Hepatic Neg liver ROS, GERD-  Medicated and Controlled,  Endo/Other  negative endocrine ROS  Renal/GU negative Renal ROS Bladder dysfunction      Musculoskeletal  (+) Arthritis -, Osteoarthritis and Rheumatoid disorders,    Abdominal   Peds  Hematology negative hematology ROS (+)   Anesthesia Other Findings   Reproductive/Obstetrics negative OB ROS                         Anesthesia Physical Anesthesia Plan  ASA: III  Anesthesia Plan: MAC   Post-op Pain Management:    Induction: Intravenous  Airway Management Planned: Nasal Cannula  Additional Equipment:   Intra-op Plan:   Post-operative Plan:   Informed Consent: I have reviewed the patients History and Physical, chart, labs and discussed the procedure including the risks, benefits and alternatives for the proposed anesthesia with the patient or authorized representative who has indicated his/her understanding and acceptance.   Dental advisory given  Plan Discussed with: CRNA, Anesthesiologist and Surgeon  Anesthesia Plan Comments:         Anesthesia Quick Evaluation

## 2012-03-17 NOTE — Op Note (Signed)
Operative note [phone]

## 2012-03-17 NOTE — Transfer of Care (Signed)
Immediate Anesthesia Transfer of Care Note  Patient: [name]  Procedure(s) Performed: Procedure(s) (LRB): EXCISION SUBMANDIBULAR GLAND (N/A)  Patient Location: PACU  Anesthesia Type: MAC  Level of Consciousness: awake, alert  and oriented  Airway & Oxygen Therapy: Patient Spontanous Breathing and Patient connected to nasal cannula oxygen  Post-op Assessment: Report given to PACU RN and Post -op Vital signs reviewed and stable  Post vital signs: Reviewed and stable  Complications: No apparent anesthesia complications

## 2012-03-17 NOTE — Anesthesia Postprocedure Evaluation (Signed)
  Anesthesia Post-op Note  Patient: [name]  Procedure(s) Performed: Procedure(s) (LRB): EXCISION SUBMANDIBULAR GLAND (N/A)  Patient Location: PACU  Anesthesia Type: MAC  Level of Consciousness: awake  Airway and Oxygen Therapy: Patient Spontanous Breathing  Post-op Pain: mild  Post-op Assessment: Post-op Vital signs reviewed  Post-op Vital Signs: Reviewed  Complications: No apparent anesthesia complications

## 2012-03-17 NOTE — Op Note (Signed)
[name], [name]              ACCOUNT NO.:  [id number]  MEDICAL RECORD NO.:  [id number]  LOCATION:  [facility]                         FACILITY:  [facility]  PHYSICIAN:  Hermelinda Medicus, M.D.   DATE OF BIRTH:  09/02/1931  DATE OF PROCEDURE: DATE OF DISCHARGE:                              OPERATIVE REPORT   This patient has had multiple squamous cell tumors, has been treated by MD, Dareen Piano.  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  She now enters for this procedure.  She is on Enbrel for her severe rheumatoid arthritis.  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  Frozen section negative for margins.  OPERATION:  Resection of a squamous cell carcinoma, floor of the mouth under local MAC anesthesia.  ANESTHESIOLOGIST:  Judie Petit, M.D.  SURGEON:  Hermelinda Medicus, M.D.  PROCEDURE:  The patient was placed in supine position under local MAC anesthesia.  The patient was prepped and draped in the usual manner using Betadine usual head drape.  Topical Xylocaine was used on the oral cavity and then 1% Xylocaine with epinephrine, and this lesion was carefully removed using the cold excision as well as Bovie electrocoagulation set at 20, and once the lesion was removed, we had one area of slight fullness.  We felt we are rounded very well, but one area superior or posterior left for frozen section, that was negative. We then after hemostasis was established.  We closed this with 3-0 chromic catgut and 5-0 chromic catgut, and the patient did extremely well during and postop, had no pain, was completely comfortable, and will be kept overnight, 23-hour observation because of her severe rheumatoid arthritis and further medical  problems.  Her followup will be in 5 days, then 10 days, and 3 weeks, 6 weeks, 3 months, 6 months, a year, and annually after that.          ______________________________ Hermelinda Medicus, M.D.     JC/MEDQ  D:  03/17/2012  T:  03/17/2012  Job:  [id number]  cc:   Hal T. Stoneking, M.D.

## 2012-03-17 NOTE — Progress Notes (Signed)
Patient doing well SaO2 97% fluid intake adequate We will continue full liquid diet

## 2012-03-17 NOTE — Progress Notes (Signed)
Pt admitted to 5149 from PACU. Alert and oriented. S/P resection of squamous cell carcinoma of the mouth. Oriented to room. Call bell in reach. Daughter at bedside.

## 2012-03-18 MED ORDER — HYDROCODONE-ACETAMINOPHEN 7.5-500 MG/15ML PO SOLN: 10.0000 mL | ORAL | Status: AC | PRN

## 2012-03-18 NOTE — Progress Notes (Signed)
Patient discharged to her daughter's house. Medications and instructions reviewed with patient and daughter with no questions. IV d/c'd with cath intact. Assessment unchanged from this am. Patient is to follow up with Dr. Haroldine Laws on Monday.

## 2012-03-18 NOTE — Discharge Summary (Signed)
  [id number] Discharge summary

## 2012-03-19 ENCOUNTER — Encounter (HOSPITAL_COMMUNITY): Payer: Self-pay | Admitting: Otolaryngology

## 2012-03-19 NOTE — Discharge Summary (Signed)
NAME:  [name], SCHLACHTER              ACCOUNT NO.:  [id number]  MEDICAL RECORD NO.:  [id number]  LOCATION:                                 FACILITY:  PHYSICIAN:  Hermelinda Medicus, M.D.   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  She has also had small lesions going back to 2003, 05, and 08.  She has been treated for leukoplakia as well, right buccal mucous membrane back in 1999.  She moved here from [place], has been seen by Dr. Gwendlyn Deutscher who removed buccal mucous membrane lesion, and we removed also a squamous cell carcinoma in situ from her tongue on the right side.  She has done very well, but more recently, she has had two new biopsies, one on the mid portion of her tongue right which was showed only cytologic atypia, but the floor of her mouth anteriorly mid line showed invasive squamous cell carcinoma 2 x 2.  The final result was increased in size at 2 x 3 cm in size.  She now enters for this removal of this squamous cell carcinoma. She has a very fragile neck as Anesthesia as well as myself were concerned about intubation so we used MAC anesthesia.  She has a history allergy to DILANTIN and DOXYCYCLINE.  She has also had a hysterectomy, right hip surgery for fracture in 1990.  She has had a breast lumpectomy, right foot surgery with pins in her toes, has rheumatoid arthritis since 89.  She has also had previous precancerous and cancerous lesions removed from her oral cavity as above stated.  She has also had an ORIF of her hip and has had cataracts removed.  She does have rheumatoid arthritis.  She has this unstable cervical spine as seen on CAT scan.  She has no abnormal neck nodes as seen on CAT scan.  She has  never smoked.  She drinks, she has had alcohol occasionally. Cardiovascular history is one of mild hypertension but well under control.  Stress test done in 2000.  Her EKG is regular sinus rhythm. Left axis deviation, no history of myocardial infarction.  She has also had a history of grand mal seizure as a child.  She also has urinary tract infection history.  She is on Imuran half of a 50 mg tablet orally, also takes Humira 0.8, 40 mg subcutaneous; lisinopril 10; phenobarbital 1 tablet twice a day.  She also takes multivitamins and glucosamine and vitamins C as stated in the history and physical.  PHYSICAL EXAMINATION:  VITAL SIGNS: Blood pressure 120/80,  pulse 85, SaO2 of 95, she is 4 feet 6 inches tall, weighs 45 kilos. HEENT: Ears are clear.  Tympanic membranes look excellent.  Oral cavity shows a well-healed buccal mucous membrane where I had repaired this several approximately a year and half before.  She also had a lesion of her right tongue, mid tongue which was found to be  benign with atypia and then she has this lesion on the floor of her mouth that is bringing [place] to our attention today.  Review of this reveals 2 x 3 cm in size. Her larynx is clear.  True cords, false cords, epiglottis, base of tongue, lateral pharyngeal walls are completely clear and true cord mobility, gag reflex, tongue mobility, EOMs, facial nerve are all symmetrical. NECK: Free of any thyromegaly, cervical adenopathy or mass. CHEST: Clear.  No rales, rhonchi, or wheezes. CARDIOVASCULAR: No murmurs, opening snaps, or gallops. EXTREMITIES: Unremarkable.  Her EKG shows normal sinus rhythm with left axis deviation.  Her laboratory reveals a sodium of 134, potassium 4.6, chloride 96, BUN 17, glucose 85.  WBC 5.9, hemoglobin 12.7, hematocrit 36.6 with a normal differential.  Glucose of 85.  The patient's head under local MAC anesthesia.  The floor of the mouth resection of a squamous cell carcinoma.   Margins were checked on frozen section and found to be clear, and she has done very well.  Has been kept overnight because of the risk of any tongue swelling, but her pulse oximeter has done very well.  She is staying well within the 90s, and she is having less swelling now, has done extremely well.  She has been on clear and full liquids, has taken some Lortab elixir for pain.  FINAL DIAGNOSES:  Squamous cell carcinoma of the floor of the mouth, margins were clear and frozen section, history of rheumatoid arthritis, history of multiple lesions of her mouth removed here and at the [facility].  Further diagnoses of bladder incontinence, history of bladder infections, history of multiple joint surgery, history of osteoporosis, history of mitral valve prolapse, gastroesophageal reflux, irritable bowel syndrome, seizure disorder.  Surgical history of appendectomy in 46, hysterectomy left 76, breast lumpectomy in 89, right hip replacement in 89, foot surgery in 89, malignant lesion of tongue in 97, tongue lesion left removed in 2001, squamous cell carcinoma from skin in 2001 through 2006, bilateral cataract surgery in 2007, partial glossectomy in 2007, buccal mucous membrane resection for squamous cell carcinoma in 2010, resection left femur fracture and plate screw in 1610.          ______________________________ Hermelinda Medicus, M.D.     JC/MEDQ  D:  03/18/2012  T:  03/18/2012  Job:  [id number]  cc:   Hal T. Stoneking, M.D. Grant Ruts., D.D.S. Elray Buba, Dr.

## 2012-04-09 ENCOUNTER — Emergency Department (HOSPITAL_COMMUNITY): Payer: Medicare Other

## 2012-04-09 ENCOUNTER — Encounter (HOSPITAL_COMMUNITY): Payer: Self-pay | Admitting: Cardiology

## 2012-04-09 ENCOUNTER — Inpatient Hospital Stay (HOSPITAL_COMMUNITY)
Admission: EM | Admit: 2012-04-09 | Discharge: 2012-04-12 | DRG: 536 | Disposition: A | Discharge status: Skilled Nursing Facility | Payer: Medicare Other | Attending: Internal Medicine | Admitting: Internal Medicine

## 2012-04-09 DIAGNOSIS — D72829 Elevated white blood cell count, unspecified: Secondary | ICD-10-CM | POA: Diagnosis present

## 2012-04-09 DIAGNOSIS — I10 Essential (primary) hypertension: Secondary | ICD-10-CM | POA: Diagnosis present

## 2012-04-09 DIAGNOSIS — Z7982 Long term (current) use of aspirin: Secondary | ICD-10-CM

## 2012-04-09 DIAGNOSIS — Z5189 Encounter for other specified aftercare: Secondary | ICD-10-CM | POA: Diagnosis not present

## 2012-04-09 DIAGNOSIS — Z4789 Encounter for other orthopedic aftercare: Secondary | ICD-10-CM | POA: Diagnosis not present

## 2012-04-09 DIAGNOSIS — Z87891 Personal history of nicotine dependence: Secondary | ICD-10-CM

## 2012-04-09 DIAGNOSIS — R109 Unspecified abdominal pain: Secondary | ICD-10-CM

## 2012-04-09 DIAGNOSIS — I499 Cardiac arrhythmia, unspecified: Secondary | ICD-10-CM | POA: Diagnosis not present

## 2012-04-09 DIAGNOSIS — IMO0002 Reserved for concepts with insufficient information to code with codable children: Secondary | ICD-10-CM | POA: Diagnosis not present

## 2012-04-09 DIAGNOSIS — M069 Rheumatoid arthritis, unspecified: Secondary | ICD-10-CM | POA: Diagnosis present

## 2012-04-09 DIAGNOSIS — S3289XA Fracture of other parts of pelvis, initial encounter for closed fracture: Secondary | ICD-10-CM | POA: Diagnosis not present

## 2012-04-09 DIAGNOSIS — S32599A Other specified fracture of unspecified pubis, initial encounter for closed fracture: Secondary | ICD-10-CM | POA: Diagnosis present

## 2012-04-09 DIAGNOSIS — R1032 Left lower quadrant pain: Secondary | ICD-10-CM | POA: Diagnosis not present

## 2012-04-09 DIAGNOSIS — S32509A Unspecified fracture of unspecified pubis, initial encounter for closed fracture: Secondary | ICD-10-CM | POA: Diagnosis not present

## 2012-04-09 DIAGNOSIS — R64 Cachexia: Secondary | ICD-10-CM | POA: Diagnosis present

## 2012-04-09 DIAGNOSIS — M6281 Muscle weakness (generalized): Secondary | ICD-10-CM | POA: Diagnosis not present

## 2012-04-09 DIAGNOSIS — R279 Unspecified lack of coordination: Secondary | ICD-10-CM | POA: Diagnosis not present

## 2012-04-09 DIAGNOSIS — Y998 Other external cause status: Secondary | ICD-10-CM

## 2012-04-09 DIAGNOSIS — Z85828 Personal history of other malignant neoplasm of skin: Secondary | ICD-10-CM | POA: Diagnosis not present

## 2012-04-09 DIAGNOSIS — D7289 Other specified disorders of white blood cells: Secondary | ICD-10-CM | POA: Diagnosis not present

## 2012-04-09 DIAGNOSIS — IMO0001 Reserved for inherently not codable concepts without codable children: Secondary | ICD-10-CM | POA: Diagnosis not present

## 2012-04-09 DIAGNOSIS — M25559 Pain in unspecified hip: Secondary | ICD-10-CM | POA: Diagnosis not present

## 2012-04-09 DIAGNOSIS — E871 Hypo-osmolality and hyponatremia: Secondary | ICD-10-CM | POA: Diagnosis present

## 2012-04-09 DIAGNOSIS — R1110 Vomiting, unspecified: Secondary | ICD-10-CM | POA: Diagnosis not present

## 2012-04-09 DIAGNOSIS — K2970 Gastritis, unspecified, without bleeding: Secondary | ICD-10-CM | POA: Diagnosis not present

## 2012-04-09 DIAGNOSIS — S32592A Other specified fracture of left pubis, initial encounter for closed fracture: Secondary | ICD-10-CM

## 2012-04-09 DIAGNOSIS — S3981XA Other specified injuries of abdomen, initial encounter: Secondary | ICD-10-CM | POA: Diagnosis not present

## 2012-04-09 DIAGNOSIS — R296 Repeated falls: Secondary | ICD-10-CM | POA: Diagnosis present

## 2012-04-09 HISTORY — DX: Encounter for other specified aftercare: Z51.89

## 2012-04-09 HISTORY — DX: Other seasonal allergic rhinitis: J30.2

## 2012-04-09 HISTORY — DX: Reserved for inherently not codable concepts without codable children: IMO0001

## 2012-04-09 LAB — COMPREHENSIVE METABOLIC PANEL
ALT: 10 U/L (ref 0–35)
Alkaline Phosphatase: 65 U/L (ref 39–117)
BUN: 17 mg/dL (ref 6–23)
CO2: 24 mEq/L (ref 19–32)
Calcium: 9.8 mg/dL (ref 8.4–10.5)
GFR calc Af Amer: >90 mL/min (ref >90)
GFR calc non Af Amer: 79 mL/min — ABNORMAL LOW (ref >90)
Glucose, Bld: 140 mg/dL — ABNORMAL HIGH (ref 70–99)
Potassium: 4.4 mEq/L (ref 3.5–5.1)
Sodium: 124 mEq/L — ABNORMAL LOW (ref 135–145)

## 2012-04-09 LAB — CBC
HCT: 33.4 % — ABNORMAL LOW (ref 36.0–46.0)
Hemoglobin: 13.6 g/dL (ref 12.0–15.0)
Hemoglobin: 11.8 g/dL — ABNORMAL LOW (ref 12.0–15.0)
MCV: 89.5 fL (ref 78.0–100.0)
Platelets: 319 10*3/uL (ref 150–400)
RBC: 4.18 MIL/uL (ref 3.87–5.11)
RBC: 3.72 MIL/uL — ABNORMAL LOW (ref 3.87–5.11)
WBC: 13.8 10*3/uL — ABNORMAL HIGH (ref 4.0–10.5)

## 2012-04-09 LAB — DIFFERENTIAL
Eosinophils Relative: 0 % (ref 0–5)
Lymphocytes Relative: 4 % — ABNORMAL LOW (ref 12–46)
Lymphs Abs: 0.5 10*3/uL — ABNORMAL LOW (ref 0.7–4.0)
Monocytes Relative: 6 % (ref 3–12)

## 2012-04-09 LAB — CREATININE, SERUM
Creatinine, Ser: 0.62 mg/dL (ref 0.50–1.10)
GFR calc Af Amer: >90 mL/min (ref >90)
GFR calc non Af Amer: 84 mL/min — ABNORMAL LOW (ref >90)

## 2012-04-09 LAB — URINALYSIS, ROUTINE W REFLEX MICROSCOPIC
Bilirubin Urine: NEGATIVE
Ketones, ur: 40 mg/dL — AB
Nitrite: NEGATIVE
Urobilinogen, UA: 0.2 mg/dL (ref 0.0–1.0)

## 2012-04-09 MED ORDER — VITAMIN D3 25 MCG (1000 UNIT) PO TABS
1000.0000 [IU] | ORAL_TABLET | Freq: Every day | ORAL | Status: DC
  Administered 2012-04-09 – 2012-04-12 (×4): 1000 [IU] via ORAL
  Filled 2012-04-09 (×4): qty 1

## 2012-04-09 MED ORDER — NITROFURANTOIN MACROCRYSTAL 50 MG PO CAPS: 50.0000 mg | ORAL_CAPSULE | Freq: Every day | ORAL | Status: DC

## 2012-04-09 MED ORDER — CHOLECALCIFEROL 25 MCG (1000 UT) PO CAPS
1000.0000 [IU] | ORAL_CAPSULE | Freq: Every day | ORAL | Status: DC
Start: 2012-04-09 — End: 2012-04-09

## 2012-04-09 MED ORDER — PREDNISONE 5 MG PO TABS
5.0000 mg | ORAL_TABLET | Freq: Every day | ORAL | Status: DC
  Administered 2012-04-10 – 2012-04-12 (×3): 5 mg via ORAL
  Filled 2012-04-09 (×5): qty 1

## 2012-04-09 MED ORDER — POLYETHYLENE GLYCOL 3350 17 G PO PACK
17.0000 g | PACK | Freq: Every day | ORAL | Status: DC | PRN
  Filled 2012-04-09: qty 1

## 2012-04-09 MED ORDER — PANTOPRAZOLE SODIUM 40 MG IV SOLR
40.0000 mg | Freq: Once | INTRAVENOUS | Status: AC
  Administered 2012-04-09: 40 mg via INTRAVENOUS
  Filled 2012-04-09: qty 40

## 2012-04-09 MED ORDER — SODIUM CHLORIDE 0.9 % IV SOLN
INTRAVENOUS | Status: DC
  Administered 2012-04-09: 125 mL/h via INTRAVENOUS
  Administered 2012-04-10: 20 mL via INTRAVENOUS
  Administered 2012-04-10: 75 mL via INTRAVENOUS
  Administered 2012-04-10: 20 mL via INTRAVENOUS

## 2012-04-09 MED ORDER — ONDANSETRON HCL 4 MG/2ML IJ SOLN
INTRAMUSCULAR | Status: AC
  Administered 2012-04-09: 4 mg via INTRAVENOUS
  Filled 2012-04-09: qty 2

## 2012-04-09 MED ORDER — ONDANSETRON HCL 4 MG PO TABS
4.0000 mg | ORAL_TABLET | Freq: Four times a day (QID) | ORAL | Status: DC | PRN
  Filled 2012-04-09: qty 1

## 2012-04-09 MED ORDER — HYDROCODONE-ACETAMINOPHEN 5-325 MG PO TABS: 1.0000 | ORAL_TABLET | ORAL | Status: DC | PRN

## 2012-04-09 MED ORDER — SODIUM CHLORIDE 0.9 % IJ SOLN
3.0000 mL | Freq: Two times a day (BID) | INTRAMUSCULAR | Status: DC
  Administered 2012-04-11 – 2012-04-12 (×2): 3 mL via INTRAVENOUS

## 2012-04-09 MED ORDER — AZATHIOPRINE 50 MG PO TABS
25.0000 mg | ORAL_TABLET | Freq: Every day | ORAL | Status: DC
  Administered 2012-04-09 – 2012-04-12 (×4): 25 mg via ORAL
  Filled 2012-04-09 (×4): qty 1

## 2012-04-09 MED ORDER — ADALIMUMAB 40 MG/0.8ML ~~LOC~~ KIT: 40.0000 mg | PACK | SUBCUTANEOUS | Status: DC

## 2012-04-09 MED ORDER — ASPIRIN EC 81 MG PO TBEC
162.0000 mg | DELAYED_RELEASE_TABLET | Freq: Every day | ORAL | Status: DC
  Filled 2012-04-09 (×4): qty 2

## 2012-04-09 MED ORDER — PHENOBARBITAL 32.4 MG PO TABS
64.8000 mg | ORAL_TABLET | Freq: Two times a day (BID) | ORAL | Status: DC
  Administered 2012-04-10 – 2012-04-12 (×6): 64.8 mg via ORAL
  Filled 2012-04-09 (×6): qty 2

## 2012-04-09 MED ORDER — ONDANSETRON HCL 4 MG/2ML IJ SOLN
4.0000 mg | Freq: Once | INTRAMUSCULAR | Status: AC
Start: 2012-04-09 — End: 2012-04-09
  Administered 2012-04-09: 4 mg via INTRAVENOUS
  Filled 2012-04-09: qty 2

## 2012-04-09 MED ORDER — GLUCOSAMINE-CHONDROITIN PO CAPS: 1.0000 | ORAL_CAPSULE | Freq: Two times a day (BID) | ORAL | Status: DC

## 2012-04-09 MED ORDER — HEPARIN SODIUM (PORCINE) 5000 UNIT/ML IJ SOLN
5000.0000 [IU] | Freq: Three times a day (TID) | INTRAMUSCULAR | Status: DC
  Administered 2012-04-09 – 2012-04-12 (×8): 5000 [IU] via SUBCUTANEOUS
  Filled 2012-04-09 (×11): qty 1

## 2012-04-09 MED ORDER — ACETAMINOPHEN 325 MG PO TABS: 650.0000 mg | ORAL_TABLET | Freq: Four times a day (QID) | ORAL | Status: DC | PRN

## 2012-04-09 MED ORDER — ONDANSETRON HCL 4 MG/2ML IJ SOLN
4.0000 mg | Freq: Four times a day (QID) | INTRAMUSCULAR | Status: DC | PRN
  Administered 2012-04-09 – 2012-04-10 (×4): 4 mg via INTRAVENOUS
  Filled 2012-04-09 (×3): qty 2

## 2012-04-09 MED ORDER — MORPHINE SULFATE 4 MG/ML IJ SOLN
4.0000 mg | Freq: Once | INTRAMUSCULAR | Status: AC
  Administered 2012-04-09: 4 mg via INTRAVENOUS
  Filled 2012-04-09: qty 1

## 2012-04-09 MED ORDER — MORPHINE SULFATE 2 MG/ML IJ SOLN: 2.0000 mg | INTRAMUSCULAR | Status: DC | PRN

## 2012-04-09 MED ORDER — CALCITONIN (SALMON) 200 UNIT/ACT NA SOLN
1.0000 | Freq: Every day | NASAL | Status: DC
  Administered 2012-04-09 – 2012-04-11 (×3): 1 via NASAL
  Filled 2012-04-09 (×3): qty 3.7

## 2012-04-09 MED ORDER — VITAMIN C 500 MG PO TABS
500.0000 mg | ORAL_TABLET | Freq: Every day | ORAL | Status: DC
  Administered 2012-04-09 – 2012-04-12 (×4): 500 mg via ORAL
  Filled 2012-04-09 (×4): qty 1

## 2012-04-09 MED ORDER — ACETAMINOPHEN 650 MG RE SUPP: 650.0000 mg | Freq: Four times a day (QID) | RECTAL | Status: DC | PRN

## 2012-04-09 MED ORDER — SODIUM CHLORIDE 0.9 % IV SOLN
INTRAVENOUS | Status: DC
  Administered 2012-04-09: 17:00:00 via INTRAVENOUS

## 2012-04-09 NOTE — ED Notes (Signed)
Attempted to call report. RN discharging a pt at this time. Will call back in 15 minutes.

## 2012-04-09 NOTE — ED Provider Notes (Signed)
Medical screening examination/treatment/procedure(s) were performed by non-physician practitioner and as supervising physician I was immediately available for consultation/collaboration.  Juliet Rude. Rubin Payor, MD 04/09/12 1600

## 2012-04-09 NOTE — ED Notes (Signed)
Pt to department via EMS- pt reports that she had a fall and is know complaining of left groin pain. No deformity noted. Bp- 106/74 Hr-100

## 2012-04-09 NOTE — Progress Notes (Signed)
Patient and daughter refused to have a foley put in,educate the pt and family about the importance of  this,and why she need it,MD notified about this situation.

## 2012-04-09 NOTE — ED Notes (Signed)
Admitting MD at the bedside.  

## 2012-04-09 NOTE — ED Provider Notes (Signed)
History     CSN: [id number]  Arrival date & time 04/09/12  0741   First MD Initiated Contact with Patient 04/09/12 [phone]      Chief Complaint  Patient presents with  . Groin Pain  . Fall    (Consider location/radiation/quality/duration/timing/severity/associated sxs/prior treatment) Patient is a 78 y.o. female presenting with fall. The history is provided by the patient.  Fall The accident occurred 1 to 2 hours ago. The fall occurred while walking. The point of impact was the left hip and left knee. The pain is at a severity of 6/10. The pain is moderate. She was not ambulatory at the scene. Associated symptoms include abdominal pain, nausea and vomiting. Pertinent negatives include no fever and no numbness.  Per pt, she was walking to the bathroom with her walker during the night, states felt weak, and believes she may have tripped on something, and fell down on the floor. States now pain in the left hip. Unable to ambulate since then. Per daughter, pt was able to stand up but not walk after the incident. Pt denies feeling dizzy, lightheaded, having any CP, or focal deficits prior to the fall. Pt does report however, having "heart burn" all day yesterday. States she had pain in her abdomen all day yesterday, associated with nausea, and vomiting x1. No diarrhea. Stats took some digestive enzymes, which did not help. Took apple cider with water, which seemed to help for some time. Pt reports abdominal pain subsided now. Pt denies any other injuries during her fall. States did not hit her head.   Past Medical History  Diagnosis Date  . GERD (gastroesophageal reflux disease)   . Irritable bowel syndrome     osteoporosis  . Hypertension   . Seizures   . Rheumatoid arthritis   . Cancer     Skin cancer; removed by dermatologist  . Recurrent upper respiratory infection (URI)   . History of urinary frequency     and urgency after surgery  . 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 Abdominal hysterectomy 1974  . Appendectomy   . Cataract extraction, bilateral   . Tongue surgery     Multiple tongue surgeries  . Foot surgery     Bilateral feet  . Hip arthroplasty     Right   . Hip pinning     left upper thigh  . Colonoscopy     several colonoscopies  . Cardiac catheterization     In 2000; clean cath  . Breast surgery     Lump removed from right breast in 1989  . Submandibular gland excision 03/17/2012    Procedure: EXCISION SUBMANDIBULAR GLAND;  Surgeon: Keturah Barre, MD;  Location: [facility] OR;  Service: ENT;  Laterality: N/A;  Excision of floor of the mouth tumor    Family History  Problem Relation Age of Onset  . Anesthesia problems Neg Hx   . Hypotension Neg Hx   . Malignant hyperthermia Neg Hx   . Pseudochol deficiency Neg Hx     History  Substance Use Topics  . Smoking status: Former Smoker    Types: Cigarettes  . Smokeless tobacco: Not on file  . Alcohol Use: 0.0 oz/week    1-2 Glasses of wine per week     2-3 oz  every week    OB History  Grav Para Term Preterm Abortions TAB SAB Ect Mult Living                  Review of Systems  Constitutional: Positive for fatigue. Negative for fever and chills.  HENT: Negative for neck pain.   Eyes: Negative for visual disturbance.  Respiratory: Negative for chest tightness and shortness of breath.   Cardiovascular: Negative for chest pain, palpitations and leg swelling.  Gastrointestinal: Positive for nausea, vomiting and abdominal pain. Negative for diarrhea and constipation.  Genitourinary: Negative for dysuria and flank pain.  Musculoskeletal: Positive for arthralgias.  Skin: Negative.   Neurological: Positive for weakness. Negative for dizziness, syncope, light-headedness and numbness.    Allergies  Doxycycline; Other; and Dilantin  Home Medications   Current  Outpatient Rx  Name Route Sig Dispense Refill  . ADALIMUMAB 40 MG/0.8ML [place] KIT Subcutaneous Inject 40 mg into the skin once a week.    Marland Kitchen AMLODIPINE BESYLATE 10 MG PO TABS Oral Take 10 mg by mouth daily.    . ASPIRIN EC 81 MG PO TBEC Oral Take 162 mg by mouth daily as needed. For pain    . AZATHIOPRINE 50 MG PO TABS Oral Take 25 mg by mouth daily.    Marland Kitchen CALCITONIN (SALMON) 200 UNIT/ACT NA SOLN Nasal Place 1 spray into the nose daily.    . CHOLECALCIFEROL 1000 UNITS PO CAPS Oral Take 1,000 Units by mouth daily.    Marland Kitchen CRANBERRY PO Oral Take 1 tablet by mouth 2 (two) times daily.    Marland Kitchen FLUTICASONE PROPIONATE EX Apply externally Apply 1 application topically daily as needed. For inflamation    . GLUCOSAMINE-CHONDROITIN PO CAPS Oral Take 1 capsule by mouth 2 (two) times daily.    . L-GLUTAMIC ACID 500 MG PO TABS Oral Take 1 tablet by mouth daily.    Marland Kitchen L-CARNITINE 250 MG PO CAPS Oral Take 1 capsule by mouth daily.    Marland Kitchen LISINOPRIL 5 MG PO TABS Oral Take 5 mg by mouth daily.    Marland Kitchen LORATADINE 10 MG PO TABS Oral Take 10 mg by mouth at bedtime.    Marland Kitchen MILK THISTLE 150 MG PO CAPS Oral Take 1 capsule by mouth daily.    . ADULT MULTIVITAMIN W/MINERALS CH Oral Take 1 tablet by mouth 3 (three) times daily.    Marland Kitchen NITROFURANTOIN MACROCRYSTAL 50 MG PO CAPS Oral Take 50 mg by mouth at bedtime.    Marland Kitchen OVER THE COUNTER MEDICATION Oral Take 1 capsule by mouth 2 (two) times daily. Ace's vitamin A-C-E with Selenium    . OVER THE COUNTER MEDICATION Oral Take 1-2 tablets by mouth 3 (three) times daily. Take 2 tablets in AM, 1 tablet at [facility], and 2 tablets in PM  Calcium-Magnesium-Vitamin D supplement    . OVER THE COUNTER MEDICATION Oral Take 2 capsules by mouth 4 (four) times daily as needed. For acid reflux   Mega-zyme digestive enzyme    . OVER THE COUNTER MEDICATION Oral Take 1 tablet by mouth daily as needed. For restless leg syndrom   Magnesium malate    . PHENOBARBITAL 64.8 MG PO TABS Oral Take 64.8 mg by mouth 2 (two)  times daily.    Marland Kitchen PREDNISONE 5 MG PO TABS Oral Take 5 mg by mouth daily as needed. For rheumatoid flare-ups    . VITAMIN C 500 MG PO TABS Oral Take 500 mg by mouth daily.      BP 124/62  Pulse 99  Temp(Src) 98.3 F (  36.8 C) (Oral)  Resp 16  SpO2 96%  Physical Exam  Nursing note and vitals reviewed. Constitutional: She is oriented to person, place, and time. She appears well-developed and well-nourished. No distress.  HENT:  Head: Normocephalic.  Nose: Nose normal.  Mouth/Throat: Oropharynx is clear and moist.       Small laceration that appears few days old to the right eyebrow  Eyes: Conjunctivae and EOM are normal. Pupils are equal, round, and reactive to light.  Neck: Normal range of motion. Neck supple.  Cardiovascular: Normal rate, regular rhythm and normal heart sounds.   Pulmonary/Chest: Effort normal and breath sounds normal. No respiratory distress. She has no wheezes. She has no rales.  Abdominal: Soft. Bowel sounds are normal. She exhibits no distension. There is no tenderness. There is no rebound and no guarding.  Musculoskeletal: Normal range of motion. She exhibits no edema and no tenderness.       Tender over anterior left groin. No bruising or swelling. Full ROM of left hip passively. Pain with any ROM actively. Contusion to the left knee. Full ROM. Joint stable with negative anterior or posterior drawer signs  Neurological: She is alert and oriented to person, place, and time. No cranial nerve deficit. She exhibits normal muscle tone. Coordination normal.  Skin: Skin is warm and dry.  Psychiatric: She has a normal mood and affect.    ED Course  Procedures (including critical care time)  Pt after a fall this AM. Pain in left hip. Will get labs, x-rays.   Results for orders placed during the hospital encounter of 04/09/12  CBC      Component Value Range   WBC 13.8 (*) 4.0 - 10.5 (K/uL)   RBC 4.18  3.87 - 5.11 (MIL/uL)   Hemoglobin 13.6  12.0 - 15.0 (g/dL)    HCT 56.2  13.0 - 86.5 (%)   MCV 89.5  78.0 - 100.0 (fL)   MCH 32.5  26.0 - 34.0 (pg)   MCHC 36.4 (*) 30.0 - 36.0 (g/dL)   RDW 78.4  69.6 - 29.5 (%)   Platelets 319  150 - 400 (K/uL)  DIFFERENTIAL      Component Value Range   Neutrophils Relative 90 (*) 43 - 77 (%)   Neutro Abs 12.5 (*) 1.7 - 7.7 (K/uL)   Lymphocytes Relative 4 (*) 12 - 46 (%)   Lymphs Abs 0.5 (*) 0.7 - 4.0 (K/uL)   Monocytes Relative 6  3 - 12 (%)   Monocytes Absolute 0.8  0.1 - 1.0 (K/uL)   Eosinophils Relative 0  0 - 5 (%)   Eosinophils Absolute 0.0  0.0 - 0.7 (K/uL)   Basophils Relative 0  0 - 1 (%)   Basophils Absolute 0.0  0.0 - 0.1 (K/uL)  COMPREHENSIVE METABOLIC PANEL      Component Value Range   Sodium 124 (*) 135 - 145 (mEq/L)   Potassium 4.4  3.5 - 5.1 (mEq/L)   Chloride 84 (*) 96 - 112 (mEq/L)   CO2 24  19 - 32 (mEq/L)   Glucose, Bld 140 (*) 70 - 99 (mg/dL)   BUN 17  6 - 23 (mg/dL)   Creatinine, Ser 2.84  0.50 - 1.10 (mg/dL)   Calcium 9.8  8.4 - 13.2 (mg/dL)   Total Protein 8.2  6.0 - 8.3 (g/dL)   Albumin 4.1  3.5 - 5.2 (g/dL)   AST 27  0 - 37 (U/L)   ALT 10  0 - 35 (U/L)  Alkaline Phosphatase 65  39 - 117 (U/L)   Total Bilirubin 0.7  0.3 - 1.2 (mg/dL)   GFR calc non Af Amer 79 (*) >90 (mL/min)   GFR calc Af Amer >90  >90 (mL/min)  URINALYSIS, ROUTINE W REFLEX MICROSCOPIC      Component Value Range   Color, Urine YELLOW  YELLOW    APPearance CLEAR  CLEAR    Specific Gravity, Urine 1.018  1.005 - 1.030    pH 5.5  5.0 - 8.0    Glucose, UA NEGATIVE  NEGATIVE (mg/dL)   Hgb urine dipstick NEGATIVE  NEGATIVE    Bilirubin Urine NEGATIVE  NEGATIVE    Ketones, ur 40 (*) NEGATIVE (mg/dL)   Protein, ur NEGATIVE  NEGATIVE (mg/dL)   Urobilinogen, UA 0.2  0.0 - 1.0 (mg/dL)   Nitrite NEGATIVE  NEGATIVE    Leukocytes, UA NEGATIVE  NEGATIVE   POCT I-STAT TROPONIN I      Component Value Range   Troponin i, poc 0.01  0.00 - 0.08 (ng/mL)   Comment 3            Dg Hip Complete Left  04/09/2012   *RADIOLOGY REPORT*  Clinical Data: Fall.  Left hip pain  LEFT HIP - COMPLETE 2+ VIEW  Comparison: 12/08/2011  Findings: Compression screw and plate fixation of the left femur. This fracture is healed.  No acute fracture on the left.  No significant degenerative change in the left hip.  Large soft tissue ossification lateral to the hip joint is unchanged.  Total hip replacement on the right is in satisfactory position and alignment.  Chronic fracture of the right inferior pubic ramus is unchanged.  Diffuse vascular calcification.  IMPRESSION: No acute fracture.  Original Report Authenticated By: Camelia Phenes, M.D.    Dg Abd Acute W/chest  04/09/2012  *RADIOLOGY REPORT*  Clinical Data: Vomiting, fall  ACUTE ABDOMEN SERIES (ABDOMEN 2 VIEW & CHEST 1 VIEW)  Comparison: Pelvic films 01/22 1013  Findings: Normal cardiac silhouette.  Lungs are clear.  No free air beneath hemidiaphragm.  There is severe erosive degenerative change of the left shoulder and severe arthropathy of the right shoulder.  No dilated loops of large or small bowel.  Lateral decubitus view demonstrates no intraperitoneal free air.  There are short air fluid levels on the decubitus view.  Small amount gas in the rectum. The right hip prosthetic and left internal fixation noted. It has a remote fracture of the right inferior pubic ramus.  IMPRESSION:  1.  Short air fluid levels within the bowel could represent ileus. 2.  No evidence of high-grade obstruction.  No intraperitoneal free air. 3.  No evidence of acute fracture. 4.  No acute cardiopulmonary findings.  5.  Severe erosive degenerative change of the left shoulder.  Original Report Authenticated By: Genevive Bi, M.D.   Pt's x-rays negative, she continues to have pain in left hip. Tried to ambulate, unable to walk on left side. Spoke with radiology, advised CT left hip and pelvis.   Ct Pelvis Wo Contrast  04/09/2012  *RADIOLOGY REPORT*  Clinical Data:  Left groin pain after fall.  CT  PELVIS WITHOUT CONTRAST  Technique:  Multidetector CT imaging of the pelvis was performed following the standard protocol without intravenous contrast.  Comparison:  Pelvis left hip radiographs 04/09/2012.  Pelvis radiograph 12/08/2011.  Findings:  The bones are markedly osteopenic.  There are postsurgical changes of  right hip arthroplasty, and there is a dynamic hip screw  in the proximal left femur.  These metallic devices cause significant streak artifact through the bony pelvis. No evidence of hardware complication. Proximal left femur is intact.  The proximal right femur is intact. The superior pubic ramus is focally angulated, likely due to acute fracture on image number 14 of the coronal reformats.  A second fracture of the more distal superior pubic ramus, near the pubic symphysis, is a complete fracture without significant displacement seen on image #74 of series 2 (axials) and image #14 of series 5 (coronal images).  Additionally, there is an oblique fracture through the left inferior pubic ramus, on image #88 of series 2 9axial images).  There are two remote/chronic appearing fractures of the right inferior pubic ramus.  This prior fracture deformity is visualized on pelvis radiographs of January 2013. 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  There is approximately 3 mm anterolisthesis of L4 on L5, and 6 mm anterolisthesis of L5 on S1.  No acute sacral fracture is identified.  Atherosclerotic calcification of the distal abdominal aorta and iliac vasculature bilaterally.  Fluid-filled small bowel loops are within normal limits for caliber.  Urinary bladder difficult to visualize but grossly unremarkable.  No soft tissue mass or lymphadenopathy identified in the pelvis, with some limitation by marked streak artifact.  IMPRESSION:  1.  Acute, nondisplaced fractures of the left superior and inferior pubic rami as  described above. 2.  Remote fracture deformities of the right inferior pubic ramus appear similar to the plain films of January 2013.  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 3.  Right hip arthroplasty and left dynamic hip screw.  No hardware complication identified.  This hardware does cause marked streak artifact.  3.  Decreased bony mineralization.  Per CMS PQRS reporting requirements (PQRS Measure 24): Given the patient's age of greater than 50 and the fracture site (hip, distal radius, or spine), the patient should be tested for osteoporosis using DXA, and the appropriate treatment considered based on the DXA results.  Original Report Authenticated By: Britta Mccreedy, M.D.   12:57 PM CT positive for acute non displaced fractures of left superior and inferior pubic rami. I have spoken with [facility], will admit. Spoke with Dr. Adriana Simas with orthopedics, will consult.     1. Fracture of pubic ramus, left, closed, initial encounter   2. Hyponatremia   3. Abdominal  pain, other specified site       MDM          Lottie Mussel, PA 04/09/12 1258

## 2012-04-09 NOTE — H&P (Signed)
[facility] History and Physical  [name]:[id number] DOB: 24-Sep-1931 DOA: 04/09/2012   PCP: Ginette Otto, MD, MD   Chief Complaint: Fall  HPI:  This 78 year old female with past medical history of rheumatoid arthritis and performed extremities, also multiple falls in the last 12 months who fell at home on the day of admission. She relates the pain as a 6/10 in her pelvic area, she relates after she fell she did not lose consciousness was able to walk to the bathroom. She thinks she tripped with something. When she went back to sit down she was unable to ambulate because of the pain. After this she called her daughter she was able to stand up but she cannot walk. The patient denies fever, chills, numbness, dizziness, chest pain, unilateral weakness, diarrhea, or sick contacts.  Review of Systems:  Constitutional:  No weight loss, night sweats, Fevers, chills, fatigue.  HEENT:  No headaches, Difficulty swallowing,Tooth/dental problems,Sore throat,  No sneezing, itching, ear ache, nasal congestion, post nasal drip,  Cardio-vascular:  No chest pain, Orthopnea, PND, swelling in lower extremities, anasarca, dizziness, palpitations  GI:  No heartburn, indigestion, abdominal pain, nausea, vomiting, diarrhea, change in bowel habits, loss of appetite  Resp:  No shortness of breath with exertion or at rest. No excess mucus, no productive cough, No non-productive cough, No coughing up of blood.No change in color of mucus.No wheezing.No chest wall deformity  Skin:  no rash or lesions.  GU:  no dysuria, change in color of urine, no urgency or frequency. No flank pain.  Musculoskeletal:  No joint pain or swelling. No decreased range of motion. No back pain.  Psych:  No change in mood or affect. No depression or anxiety. No memory loss.    Past Medical History  Diagnosis Date  . GERD (gastroesophageal reflux disease)   . Irritable bowel syndrome     osteoporosis  .  Hypertension   . Seizures   . Rheumatoid arthritis   . Cancer     Skin cancer; removed by dermatologist  . Recurrent upper respiratory infection (URI)   . History of urinary frequency     and urgency after surgery  . Difficult intubation     From osteoprosis pt stated from prior surgery "They can hit the wrong spot and paralyze me"   Past Surgical History  Procedure Date  . Abdominal hysterectomy 1974  . Appendectomy   . Cataract extraction, bilateral   . Tongue surgery     Multiple tongue surgeries  . Foot surgery     Bilateral feet  . Hip arthroplasty     Right   . Hip pinning     left upper thigh  . Colonoscopy     several colonoscopies  . Cardiac catheterization     In 2000; clean cath  . Breast surgery     Lump removed from right breast in 1989  . 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 Her smoking use included Cigarettes. She smoked 0 packs per day. She does not have any smokeless tobacco history on file. She reports that she drinks alcohol. She reports that she does not use illicit drugs.  Allergies  Allergen Reactions  . Doxycycline Swelling  . Other Other (See Comments)  Antibiotic that starts with "e" does not know if it's erythromycin or not. She said she felt awful and it did not work.  . Dilantin (Phenytoin Sodium Extended) Rash    Family History  Problem Relation Age of Onset  . Anesthesia problems Neg Hx   . Hypotension Neg Hx   . Malignant hyperthermia Neg Hx   . Pseudochol deficiency Neg Hx   . Dementia Mother   . Colon cancer Father     Prior to Admission medications   Medication Sig Start Date End Date Taking? Authorizing Provider  adalimumab (HUMIRA) 40 MG/0.8ML injection Inject 40 mg into the skin once a week.   Yes Historical  Provider, MD  amLODipine (NORVASC) 10 MG tablet Take 10 mg by mouth daily.   Yes Historical Provider, MD  aspirin EC 81 MG tablet Take 162 mg by mouth daily as needed. For pain   Yes Historical Provider, MD  azaTHIOprine (IMURAN) 50 MG tablet Take 25 mg by mouth daily.   Yes Historical Provider, MD  calcitonin, salmon, (MIACALCIN/FORTICAL) 200 UNIT/ACT nasal spray Place 1 spray into the nose daily.   Yes Historical Provider, MD  Cholecalciferol (D3-1000) 1000 UNITS capsule Take 1,000 Units by mouth daily.   Yes Historical Provider, MD  CRANBERRY PO Take 1 tablet by mouth 2 (two) times daily.   Yes Historical Provider, MD  FLUTICASONE PROPIONATE EX Apply 1 application topically daily as needed. For inflamation   Yes Historical Provider, MD  Glucosamine-Chondroit-Vit C-Mn (GLUCOSAMINE-CHONDROITIN) CAPS Take 1 capsule by mouth 2 (two) times daily.   Yes Historical Provider, MD  Glutamic Acid HCl (L-GLUTAMIC ACID) 500 MG TABS Take 1 tablet by mouth daily.   Yes Historical Provider, MD  LevOCARNitine (L-CARNITINE) 250 MG CAPS Take 1 capsule by mouth daily.   Yes Historical Provider, MD  lisinopril (PRINIVIL,ZESTRIL) 5 MG tablet Take 5 mg by mouth daily.   Yes Historical Provider, MD  loratadine (CLARITIN) 10 MG tablet Take 10 mg by mouth at bedtime.   Yes Historical Provider, MD  Milk Thistle 150 MG CAPS Take 1 capsule by mouth daily.   Yes Historical Provider, MD  Multiple Vitamin (MULITIVITAMIN WITH MINERALS) TABS Take 1 tablet by mouth 3 (three) times daily.   Yes Historical Provider, MD  nitrofurantoin (MACRODANTIN) 50 MG capsule Take 50 mg by mouth at bedtime.   Yes Historical Provider, MD  OVER THE COUNTER MEDICATION Take 1 capsule by mouth 2 (two) times daily. Ace's vitamin A-C-E with Selenium   Yes Historical Provider, MD  OVER THE COUNTER MEDICATION Take 1-2 tablets by mouth 3 (three) times daily. Take 2 tablets in AM, 1 tablet at Noon, and 2 tablets in PM  Calcium-Magnesium-Vitamin D supplement    Yes Historical Provider, MD  OVER THE COUNTER MEDICATION Take 2 capsules by mouth 4 (four) times daily as needed. For acid reflux   Mega-zyme digestive enzyme   Yes Historical Provider, MD  OVER THE COUNTER MEDICATION Take 1 tablet by mouth daily as needed. For restless leg syndrom   Magnesium malate   Yes Historical Provider, MD  PHENobarbital (LUMINAL) 64.8 MG tablet Take 64.8 mg by mouth 2 (two) times daily.   Yes Historical Provider, MD  predniSONE (DELTASONE) 5 MG tablet Take 5 mg by mouth daily as needed. For rheumatoid flare-ups   Yes Historical Provider, MD  vitamin C (ASCORBIC ACID) 500 MG tablet Take 500 mg by mouth daily.   Yes Historical Provider, MD   Physical Exam: Filed Vitals:  04/09/12 0742 04/09/12 1004 04/09/12 1141  BP: 124/62 109/51 127/56  Pulse: 99 69 66  Temp: 98.3 F (36.8 C)  97.9 F (36.6 C)  TempSrc: Oral  Oral  Resp: 16 16 16   SpO2: 96% 97% 97%   BP 127/56  Pulse 66  Temp(Src) 97.9 F (36.6 C) (Oral)  Resp 16  SpO2 97%  General Appearance:    Alert, cooperative, no distress, cachectic appearing   Head:    Normocephalic, without obvious abnormality, atraumatic  Eyes:    PERRL, conjunctiva/corneas clear, EOM's intact, fundi    benign, both eyes  Ears:    Normal TM's and external ear canals, both ears  Nose:   Nares normal, septum midline, mucosa normal, no drainage    or sinus tenderness  Throat:   dry mucous membrane and tongue   Neck:   Supple, symmetrical, trachea midline, no adenopathy;    thyroid:  no enlargement/tenderness/nodules; no carotid   bruit or JVD  Back:     Symmetric, no curvature, ROM normal, no CVA tenderness  Lungs:     Clear to auscultation bilaterally, respirations unlabored  Chest Wall:    No tenderness or deformity   Heart:    Regular rate and rhythm, S1 and S2 normal, no murmur, rub   or gallop     Abdomen:     Soft, non-tender, bowel sounds active all four quadrants,    no masses, no organomegaly        Extremities:    Extremities normal, atraumatic, no cyanosis or edema, older deviation of her wrist. Also has external deviation of her toes bilaterally.   Pulses:   2+ and symmetric all extremities  Skin:   Skin color, texture, turgor normal, no rashes or lesions  Lymph nodes:   Cervical, supraclavicular, and axillary nodes normal  Neurologic:   CNII-XII intact, normal strength, sensation and reflexes    throughout    Labs on Admission:  Basic Metabolic Panel:  Lab 04/09/12 4540  NA 124*  K 4.4  CL 84*  CO2 24  GLUCOSE 140*  BUN 17  CREATININE 0.74  CALCIUM 9.8  MG --  PHOS --   Liver Function Tests:  Lab 04/09/12 0810  AST 27  ALT 10  ALKPHOS 65  BILITOT 0.7  PROT 8.2  ALBUMIN 4.1   No results found for this basename: LIPASE:5,AMYLASE:5 in the last 168 hours No results found for this basename: AMMONIA:5 in the last 168 hours CBC:  Lab 04/09/12 0810  WBC 13.8*  NEUTROABS 12.5*  HGB 13.6  HCT 37.4  MCV 89.5  PLT 319   Cardiac Enzymes: No results found for this basename: CKTOTAL:5,CKMB:5,CKMBINDEX:5,TROPONINI:5 in the last 168 hours BNP: No components found with this basename: POCBNP:5 CBG: No results found for this basename: GLUCAP:5 in the last 168 hours  Radiological Exams on Admission: Dg Hip Complete Left  04/09/2012  *RADIOLOGY REPORT*  Clinical Data: Fall.  Left hip pain  LEFT HIP - COMPLETE 2+ VIEW  Comparison: 12/08/2011  Findings: Compression screw and plate fixation of the left femur. This fracture is healed.  No acute fracture on the left.  No significant degenerative change in the left hip.  Large soft tissue ossification lateral to the hip joint is unchanged.  Total hip replacement on the right is in satisfactory position and alignment.  Chronic fracture of the right inferior pubic ramus is unchanged.  Diffuse vascular calcification.  IMPRESSION: No acute fracture.  Original Report Authenticated By: DAVID C.  Chestine Spore, M.D.   Ct Pelvis Wo Contrast  04/09/2012   *RADIOLOGY REPORT*  Clinical Data:  Left groin pain after fall.  CT PELVIS WITHOUT CONTRAST  Technique:  Multidetector CT imaging of the pelvis was performed following the standard protocol without intravenous contrast.  Comparison:  Pelvis left hip radiographs 04/09/2012.  Pelvis radiograph 12/08/2011.  Findings:  The bones are markedly osteopenic.  There are postsurgical changes of  right hip arthroplasty, and there is a dynamic hip screw in the proximal left femur.  These metallic devices cause significant streak artifact through the bony pelvis. No evidence of hardware complication. Proximal left femur is intact.  The proximal right femur is intact. The superior pubic ramus is focally angulated, likely due to acute fracture on image number 14 of the coronal reformats.  A second fracture of the more distal superior pubic ramus, near the pubic symphysis, is a complete fracture without significant displacement seen on image #74 of series 2 (axials) and image #14 of series 5 (coronal images).  Additionally, there is an oblique fracture through the left inferior pubic ramus, on image #88 of series 2 9axial images).  There are two remote/chronic appearing fractures of the right inferior pubic ramus.  This prior fracture deformity is visualized on pelvis radiographs of January 2013. 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  There is approximately 3 mm anterolisthesis of L4 on L5, and 6 mm anterolisthesis of L5 on S1.  No acute sacral fracture is identified.  Atherosclerotic calcification of the distal abdominal aorta and iliac vasculature bilaterally.  Fluid-filled small bowel loops are within normal limits for caliber.  Urinary bladder difficult to visualize but grossly unremarkable.  No soft tissue mass or lymphadenopathy identified in the pelvis, with some limitation by marked streak artifact.  IMPRESSION:  1.  Acute,  nondisplaced fractures of the left superior and inferior pubic rami as described above. 2.  Remote fracture deformities of the right inferior pubic ramus appear similar to the plain films of January 2013.  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 3.  Right hip arthroplasty and left dynamic hip screw.  No hardware complication identified.  This hardware does cause marked streak artifact.  3.  Decreased bony mineralization.  Per CMS PQRS reporting requirements (PQRS Measure 24): Given the patient's age of greater than 50 and the fracture site (hip, distal radius, or spine), the patient should be tested for osteoporosis using DXA, and the appropriate treatment considered based on the DXA results.  Original Report Authenticated By: Britta Mccreedy, M.D.   Dg Abd Acute W/chest  04/09/2012  *RADIOLOGY REPORT*  Clinical Data: Vomiting, fall  ACUTE ABDOMEN SERIES (ABDOMEN 2 VIEW & CHEST 1 VIEW)  Comparison: Pelvic films 01/22 1013  Findings: Normal cardiac silhouette.  Lungs are clear.  No free air beneath hemidiaphragm.  There is severe erosive degenerative change of the left shoulder and severe arthropathy of the right shoulder.  No dilated loops of large or small bowel.  Lateral decubitus view demonstrates no intraperitoneal free air.  There are short air fluid levels on the decubitus view.  Small amount gas in the rectum. The right hip prosthetic and left internal fixation noted. It has a remote fracture of the right inferior pubic ramus.  IMPRESSION:  1.  Short air fluid levels within the bowel could represent ileus. 2.  No evidence of high-grade obstruction.  No intraperitoneal free air. 3.  No evidence of acute fracture. 4.  No acute cardiopulmonary findings.  5.  Severe erosive degenerative change of the left shoulder.  Original Report Authenticated By: Genevive Bi, M.D.    EKG: Independently reviewed. Normal sinus rhythm with left axis deviation with a drop she QRS once.  Assessment/Plan: Active  Problems:  Pubic ramus fracture: At this time the patient cannot bear weight on that leg secondary to pain, we'll start her on IV narcotics, will consult orthopedics M.D. At this time there is no signs of bleeding as her hemoglobin is stable at 13. This could be hemoconcentrated as she seems to be slightly dehydrated by physical exam. So we'll continue to keep it close on the hemoglobin after hydration. We'll get physical therapy consult. And the patient was most likely need skilled nursing facility placement, so we'll contact social worker. Maryclare Labrador check strict I.'s and O., check urinary sodium and urinary creatinine.  Leukocytosis: This most likely stress emargination. A chest x-ray was done and showed no infiltrates. A UA was done for urinary tract infections that shows no white blood cells. She denies any fever chills cough or diarrhea the most likely cause of her leukocytosis stress emargination secondary to the nondisplaced pubic ramus fracture.  Hyponatremia She seems to be dehydrated by physical exam and by basic metabolic panel, her creatinine is within normal limits she is hyponatremic and hypochloremic. She is not on any diuretic at home. Start her on IV fluids aggressively. Will hold her lisinopril and check a basic metabolic panel in the morning.  HTN (hypertension) Blood pressure seems to be within normal limits. The patient relates no orthostasis. But as she seems dehydrated by physical exam. Will hold her blood pressure medications.   Code Status: Full code Family Communication: Daughter [phone] Disposition Plan: SNF  Marinda Elk, MD  [facility] Pager [phone]  If 7PM-7AM, please contact night-coverage [email or web address] Password [email or web address] 04/09/2012, 1:35 PM

## 2012-04-09 NOTE — Progress Notes (Signed)
PHARMACIST - PHYSICIAN ORDER COMMUNICATION  CONCERNING: P&T Medication Policy on Herbal Medications  DESCRIPTION:  This patient's order for glucosamine-chondroitin (home medication) has been noted.  This product(s) is classified as an "herbal" or natural product. Due to a lack of definitive safety studies or FDA approval, nonstandard manufacturing practices, plus the potential risk of unknown drug-drug interactions while on inpatient medications, the Pharmacy and Therapeutics Committee does not permit the use of "herbal" or natural products of this type within [facility]   ACTION TAKEN: The pharmacy department is unable to verify this order at this time and your patient has been informed of this safety policy. Please reevaluate patient's clinical condition at discharge and address if the herbal or natural product(s) should be resumed at that time.   Cecily Lawhorne B. Meredeth Ide, PharmD

## 2012-04-10 DIAGNOSIS — S32509A Unspecified fracture of unspecified pubis, initial encounter for closed fracture: Secondary | ICD-10-CM

## 2012-04-10 DIAGNOSIS — E871 Hypo-osmolality and hyponatremia: Secondary | ICD-10-CM

## 2012-04-10 DIAGNOSIS — I10 Essential (primary) hypertension: Secondary | ICD-10-CM

## 2012-04-10 DIAGNOSIS — D7289 Other specified disorders of white blood cells: Secondary | ICD-10-CM

## 2012-04-10 LAB — COMPREHENSIVE METABOLIC PANEL
ALT: 8 U/L (ref 0–35)
AST: 20 U/L (ref 0–37)
CO2: 19 mEq/L (ref 19–32)
Calcium: 8.5 mg/dL (ref 8.4–10.5)
Chloride: 90 mEq/L — ABNORMAL LOW (ref 96–112)
GFR calc Af Amer: >90 mL/min (ref >90)
GFR calc non Af Amer: 86 mL/min — ABNORMAL LOW (ref >90)
Glucose, Bld: 91 mg/dL (ref 70–99)
Sodium: 127 mEq/L — ABNORMAL LOW (ref 135–145)
Total Bilirubin: 0.6 mg/dL (ref 0.3–1.2)

## 2012-04-10 LAB — CREATININE, URINE, RANDOM: Creatinine, Urine: 87.66 mg/dL

## 2012-04-10 LAB — SODIUM, URINE, RANDOM: Sodium, Ur: 16 mEq/L

## 2012-04-10 LAB — CBC
MCH: 31.2 pg (ref 26.0–34.0)
MCHC: 34.8 g/dL (ref 30.0–36.0)
Platelets: 313 10*3/uL (ref 150–400)
RDW: 13.9 % (ref 11.5–15.5)

## 2012-04-10 MED ORDER — NONFORMULARY OR COMPOUNDED ITEM: 1.0000 | Freq: Every day | Status: DC

## 2012-04-10 MED ORDER — NON FORMULARY: 1300.0000 mg | Freq: Every day | Status: DC

## 2012-04-10 MED ORDER — FLUTICASONE PROPIONATE 0.005 % EX OINT
TOPICAL_OINTMENT | Freq: Two times a day (BID) | CUTANEOUS | Status: DC
  Filled 2012-04-10 (×2): qty 30

## 2012-04-10 MED ORDER — CHOLECALCIFEROL 25 MCG (1000 UT) PO CAPS: 1000.0000 [IU] | ORAL_CAPSULE | Freq: Every day | ORAL | Status: DC

## 2012-04-10 MED ORDER — AMLODIPINE BESYLATE 10 MG PO TABS
10.0000 mg | ORAL_TABLET | Freq: Every day | ORAL | Status: DC
  Filled 2012-04-10 (×3): qty 1

## 2012-04-10 MED ORDER — NON FORMULARY: Freq: Two times a day (BID) | Status: DC

## 2012-04-10 MED ORDER — FLUTICASONE PROPIONATE 0.05 % EX CREA: TOPICAL_CREAM | Freq: Two times a day (BID) | CUTANEOUS | Status: DC

## 2012-04-10 MED ORDER — CEPHALEXIN 500 MG PO CAPS
500.0000 mg | ORAL_CAPSULE | Freq: Three times a day (TID) | ORAL | Status: DC
  Administered 2012-04-10 (×4): 500 mg via ORAL
  Filled 2012-04-10 (×8): qty 1

## 2012-04-10 MED ORDER — PRESCRIPTION MEDICATION: 1.0000 "application " | Freq: Two times a day (BID) | Status: DC

## 2012-04-10 MED ORDER — NONFORMULARY OR COMPOUNDED ITEM
Freq: Every day | Status: DC | PRN
  Administered 2012-04-11: 16:00:00 via ORAL
  Filled 2012-04-10: qty 1

## 2012-04-10 MED ORDER — NITROFURANTOIN MACROCRYSTAL 50 MG PO CAPS
50.0000 mg | ORAL_CAPSULE | Freq: Every day | ORAL | Status: DC
  Administered 2012-04-11: 50 mg via ORAL
  Filled 2012-04-10 (×3): qty 1

## 2012-04-10 MED ORDER — LISINOPRIL 5 MG PO TABS
5.0000 mg | ORAL_TABLET | Freq: Every day | ORAL | Status: DC
  Administered 2012-04-10 – 2012-04-12 (×3): 5 mg via ORAL
  Filled 2012-04-10 (×3): qty 1

## 2012-04-10 MED ORDER — PANTOPRAZOLE SODIUM 40 MG PO TBEC
40.0000 mg | DELAYED_RELEASE_TABLET | Freq: Every day | ORAL | Status: DC
  Administered 2012-04-10 – 2012-04-12 (×3): 40 mg via ORAL
  Filled 2012-04-10 (×3): qty 1

## 2012-04-10 MED ORDER — NONFORMULARY OR COMPOUNDED ITEM
Freq: Two times a day (BID) | Status: DC
  Filled 2012-04-10: qty 1

## 2012-04-10 NOTE — Progress Notes (Signed)
[facility] PROGRESS NOTE  [name] JXB:[id number] DOB: Dec 08, 1930 DOA: 04/09/2012   Assessment/Plan: Patient Active Hospital Problem List: Pubic ramus fracture (04/09/2012) -continue narcotics. Minimal use.  -awaiting ortho consult. -PT ambulated the patient.  Leukocytosis (04/09/2012) -resolved.  Hyponatremia (04/09/2012) -continue IV fluids, hyponatremia improving.  -Her urinary sodium was 16. Continue to hold blood pressure medications  HTN (hypertension) (04/09/2012) -blood pressure moderately high, continue Norvasc.  Rheumatoid arthritis (04/09/2012)  stable continue meds   Code Status: Full code  Family Communication: Daughter [phone]  Disposition Plan: SNF   Lambert Keto, MD  [facility] Pager [phone]  If 7PM-7AM, please contact night-coverage [email or web address] Password [facility] 04/10/2012, 12:29 PM   LOS: 1 day  Subjective: No complains  Objective: Filed Vitals:   04/09/12 2031 04/10/12 0127 04/10/12 0448 04/10/12 1134  BP: 156/74 153/80 141/76 142/80  Pulse: 73 105 100   Temp: 98.2 F (36.8 C)  98.4 F (36.9 C)   TempSrc: Oral  Oral   Resp: 16  18   Height:      Weight:      SpO2: 94%  94%     Intake/Output Summary (Last 24 hours) at 04/10/12 1229 Last data filed at 04/10/12 0700  Gross per 24 hour  Intake    360 ml  Output      0 ml  Net    360 ml   Weight change:   Exam:  General: Alert, awake, oriented x3, in no acute distress.  HEENT: No bruits, no goiter.  Heart: Regular rate and rhythm, without murmurs, rubs, gallops.  Lungs: Good air movement, bilateral air movement.  Abdomen: Soft, nontender, nondistended, positive bowel sounds.  Neuro: Grossly intact, nonfocal.   Data Reviewed: Basic Metabolic Panel:  Lab 04/10/12 4132 04/09/12 1542 04/09/12 0810  NA 127* -- 124*  K 4.0 -- 4.4  CL 90* -- 84*  CO2 19 -- 24  GLUCOSE 91 -- 140*  BUN 13 -- 17  CREATININE 0.56 0.62 0.74  CALCIUM 8.5 -- 9.8  MG  -- -- --  PHOS -- -- --   Liver Function Tests:  Lab 04/10/12 0515 04/09/12 0810  AST 20 27  ALT 8 10  ALKPHOS 53 65  BILITOT 0.6 0.7  PROT 7.0 8.2  ALBUMIN 3.5 4.1   No results found for this basename: LIPASE:5,AMYLASE:5 in the last 168 hours No results found for this basename: AMMONIA:5 in the last 168 hours CBC:  Lab 04/10/12 0515 04/09/12 1542 04/09/12 0810  WBC 10.3 10.5 13.8*  NEUTROABS -- -- 12.5*  HGB 12.1 11.8* 13.6  HCT 34.8* 33.4* 37.4  MCV 89.7 89.8 89.5  PLT 313 287 319   Cardiac Enzymes: No results found for this basename: CKTOTAL:5,CKMB:5,CKMBINDEX:5,TROPONINI:5 in the last 168 hours BNP: No components found with this basename: POCBNP:5 CBG: No results found for this basename: GLUCAP:5 in the last 168 hours  No results found for this or any previous visit (from the past 240 hour(s)).   Studies: Dg Hip Complete Left  04/09/2012  *RADIOLOGY REPORT*  Clinical Data: Fall.  Left hip pain  LEFT HIP - COMPLETE 2+ VIEW  Comparison: 12/08/2011  Findings: Compression screw and plate fixation of the left femur. This fracture is healed.  No acute fracture on the left.  No significant degenerative change in the left hip.  Large soft tissue ossification lateral to the hip joint is unchanged.  Total hip replacement on the right is in satisfactory position and alignment.  Chronic fracture  of the right inferior pubic ramus is unchanged.  Diffuse vascular calcification.  IMPRESSION: No acute fracture.  Original Report Authenticated By: Camelia Phenes, M.D.   Ct Pelvis Wo Contrast  04/09/2012  *RADIOLOGY REPORT*  Clinical Data:  Left groin pain after fall.  CT PELVIS WITHOUT CONTRAST  Technique:  Multidetector CT imaging of the pelvis was performed following the standard protocol without intravenous contrast.  Comparison:  Pelvis left hip radiographs 04/09/2012.  Pelvis radiograph 12/08/2011.  Findings:  The bones are markedly osteopenic.  There are postsurgical changes of  right  hip arthroplasty, and there is a dynamic hip screw in the proximal left femur.  These metallic devices cause significant streak artifact through the bony pelvis. No evidence of hardware complication. Proximal left femur is intact.  The proximal right femur is intact. The superior pubic ramus is focally angulated, likely due to acute fracture on image number 14 of the coronal reformats.  A second fracture of the more distal superior pubic ramus, near the pubic symphysis, is a complete fracture without significant displacement seen on image #74 of series 2 (axials) and image #14 of series 5 (coronal images).  Additionally, there is an oblique fracture through the left inferior pubic ramus, on image #88 of series 2 9axial images).  There are two remote/chronic appearing fractures of the right inferior pubic ramus.  This prior fracture deformity is visualized on pelvis radiographs of January 2013. 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  There is approximately 3 mm anterolisthesis of L4 on L5, and 6 mm anterolisthesis of L5 on S1.  No acute sacral fracture is identified.  Atherosclerotic calcification of the distal abdominal aorta and iliac vasculature bilaterally.  Fluid-filled small bowel loops are within normal limits for caliber.  Urinary bladder difficult to visualize but grossly unremarkable.  No soft tissue mass or lymphadenopathy identified in the pelvis, with some limitation by marked streak artifact.  IMPRESSION:  1.  Acute, nondisplaced fractures of the left superior and inferior pubic rami as described above. 2.  Remote fracture deformities of the right inferior pubic ramus appear similar to the plain films of January 2013.  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 3.  Right hip arthroplasty and left dynamic hip screw.  No hardware complication identified.  This hardware does cause marked streak artifact.  3.   Decreased bony mineralization.  Per CMS PQRS reporting requirements (PQRS Measure 24): Given the patient's age of greater than 50 and the fracture site (hip, distal radius, or spine), the patient should be tested for osteoporosis using DXA, and the appropriate treatment considered based on the DXA results.  Original Report Authenticated By: Britta Mccreedy, M.D.   Dg Abd Acute W/chest  04/09/2012  *RADIOLOGY REPORT*  Clinical Data: Vomiting, fall  ACUTE ABDOMEN SERIES (ABDOMEN 2 VIEW & CHEST 1 VIEW)  Comparison: Pelvic films 01/22 1013  Findings: Normal cardiac silhouette.  Lungs are clear.  No free air beneath hemidiaphragm.  There is severe erosive degenerative change of the left shoulder and severe arthropathy of the right shoulder.  No dilated loops of large or small bowel.  Lateral decubitus view demonstrates no intraperitoneal free air.  There are short air fluid levels on the decubitus view.  Small amount gas in the rectum. The right hip prosthetic and left internal fixation noted. It has a remote fracture of the right inferior pubic ramus.  IMPRESSION:  1.  Short air fluid levels within the bowel could represent ileus. 2.  No evidence of high-grade obstruction.  No intraperitoneal free air. 3.  No evidence of acute fracture. 4.  No acute cardiopulmonary findings.  5.  Severe erosive degenerative change of the left shoulder.  Original Report Authenticated By: Genevive Bi, M.D.    Scheduled Meds:   . adalimumab  40 mg Subcutaneous Weekly  . amLODipine  10 mg Oral Daily  . aspirin EC  162 mg Oral Daily  . azaTHIOprine  25 mg Oral Daily  . calcitonin (salmon)  1 spray Alternating Nares Daily  . cephALEXin  500 mg Oral Q8H  . cholecalciferol  1,000 Units Oral Daily  . heparin  5,000 Units Subcutaneous Q8H  . lisinopril  5 mg Oral Daily  . nitrofurantoin  50 mg Oral QHS  . pantoprazole  40 mg Oral Q1200  . PHENobarbital  64.8 mg Oral BID  . predniSONE  5 mg Oral Q breakfast  . sodium chloride   3 mL Intravenous Q12H  . vitamin C  500 mg Oral Daily  . DISCONTD: Cholecalciferol  1,000 Units Oral Daily  . DISCONTD: Glucosamine-Chondroitin  1 capsule Oral BID  . DISCONTD: nitrofurantoin  50 mg Oral QHS   Continuous Infusions:   . sodium chloride 75 mL (04/10/12 0848)  . DISCONTD: sodium chloride 100 mL/hr at 04/09/12 1633

## 2012-04-10 NOTE — Evaluation (Addendum)
Physical Therapy Evaluation Patient Details Name: [name] MRN: [id number] DOB: 15-Aug-1931 Today's Date: 04/10/2012 Time: 4098-1191 PT Time Calculation (min): 35 min  PT Assessment / Plan / Recommendation Clinical Impression  Pt. is s/p fall resulting in pubic ramus fx and is now with decreased bed mobility, transfers, gait and activity tolerance.Pt. Will benefit from acute PT to address these issues.    PT Assessment  Patient needs continued PT services    Follow Up Recommendations  Skilled nursing facility    Barriers to Discharge Decreased caregiver support      lEquipment Recommendations  Defer to next venue    Recommendations for Other Services     Frequency Min 3X/week    Precautions / Restrictions Restrictions Weight Bearing Restrictions:  (none specified yet, awaiting ortho consult)   Pertinent Vitals/Pain No pain to speak of.  No distress.      Mobility  Bed Mobility Bed Mobility: Supine to Sit;Sit to Supine Supine to Sit: 4: Min assist;HOB elevated;With rails Sit to Supine: Not Tested (comment);Other (comment) (pt. remained in bedside cahir) Details for Bed Mobility Assistance: cues for technique and safety Transfers Transfers: Sit to Stand;Stand to Sit;Stand Pivot Transfers Sit to Stand: 4: Min assist;From bed;With upper extremity assist Stand to Sit: 4: Min assist;With upper extremity assist;To chair/3-in-1 Stand Pivot Transfers: 4: Min assist;With armrests Details for Transfer Assistance: pt. needed physical assist for balance and for safety Ambulation/Gait Ambulation/Gait Assistance: Not tested (comment);Other (comment) (awaiting WB status from ortho)    Exercises     PT Diagnosis: Difficulty walking  PT Problem List: Decreased activity tolerance;Decreased mobility;Decreased knowledge of use of DME;Decreased knowledge of precautions PT Treatment Interventions: DME instruction;Gait training;Functional mobility training;Therapeutic  activities;Therapeutic exercise;Patient/family education   PT Goals Acute Rehab PT Goals PT Goal Formulation: With patient Time For Goal Achievement: 04/17/12 Potential to Achieve Goals: Good Pt will go Supine/Side to Sit: with supervision PT Goal: Supine/Side to Sit - Progress: Goal set today Pt will go Sit to Supine/Side: with supervision PT Goal: Sit to Supine/Side - Progress: Goal set today Pt will go Sit to Stand: with supervision PT Goal: Sit to Stand - Progress: Goal set today Pt will go Stand to Sit: with supervision PT Goal: Stand to Sit - Progress: Goal set today Pt will Transfer Bed to Chair/Chair to Bed: with supervision PT Transfer Goal: Bed to Chair/Chair to Bed - Progress: Goal set today Pt will Ambulate: 1 - 15 feet;with least restrictive assistive device PT Goal: Ambulate - Progress: Goal set today  Visit Information  Last PT Received On: 04/10/12 Assistance Needed: +2    Subjective Data  Subjective: reports falling whil going to the bathroom Patient Stated Goal: living alone   Prior Functioning  Home Living Lives With: Alone Available Help at Discharge: Skilled Nursing Facility Type of Home: Apartment Home Access: Level entry Home Adaptive Equipment: Dan Humphreys - four wheeled;Other (comment);Wheelchair - [employer] - standard (3 wheeled) Prior Function Level of Independence: Independent with assistive device(s) Able to Take Stairs?: No Driving: No Vocation: Retired Musician: No difficulties Dominant Hand: Right    Cognition  Overall Cognitive Status: Appears within functional limits for tasks assessed/performed Arousal/Alertness: Awake/alert Orientation Level: Oriented X4 / Intact Behavior During Session: [facility] for tasks performed    Extremity/Trunk Assessment Right Upper Extremity Assessment RUE ROM/Strength/Tone: Deficits;Unable to fully assess (h/o RA with deformities) Left Upper Extremity Assessment LUE ROM/Strength/Tone:  Deficits;Unable to fully assess (h/o of RA with deformities) Right Lower Extremity Assessment RLE ROM/Strength/Tone: Unable  to fully assess (due to pelvic fx, generally at least 3/5) RLE Sensation: WFL - Light Touch Left Lower Extremity Assessment LLE ROM/Strength/Tone: WFL for tasks assessed LLE Sensation: WFL - Light Touch   Balance    End of Session PT - End of Session Equipment Utilized During Treatment: Gait belt Activity Tolerance: Patient tolerated treatment well Patient left: in chair;with call bell/phone within reach;with family/visitor present Nurse Communication: Mobility status   Ferman Hamming 04/10/2012, 1:51 PM Acute Rehabilitation Services [phone] (pager)   Maintained TDWB until ortho provides further WB instructions.

## 2012-04-10 NOTE — Consult Note (Signed)
Reason for Consult :left hip pain Referring Physician: THPCP  [name] is an 78 y.o. female.  HPI: recent fall. Recent multiple falls.  Past Medical History  Diagnosis Date  . GERD (gastroesophageal reflux disease)   . Irritable bowel syndrome     osteoporosis  . Hypertension   . Seizures   . Rheumatoid arthritis   . Recurrent upper respiratory infection (URI)   . History of urinary frequency     and urgency after surgery  . Difficult intubation     From osteoprosis pt stated from prior surgery "They can hit the wrong spot and paralyze me"  . Cancer     Skin cancer; removed by dermatologist  . Seasonal allergies   . Blood transfusion     hx of with out reaction    Past Surgical History  Procedure Date  . Abdominal hysterectomy 1974  . Appendectomy   . Cataract extraction, bilateral   . Tongue surgery     Multiple tongue surgeries  . Foot surgery     Bilateral feet  . Hip arthroplasty     Right   . Hip pinning     left upper thigh  . Colonoscopy     several colonoscopies  . Cardiac catheterization     In 2000; clean cath  . Breast surgery     Lump removed from right breast in 1989  . 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 Anesthesia problems Neg Hx   . Hypotension Neg Hx   . Malignant hyperthermia Neg Hx   . Pseudochol deficiency Neg Hx   . Dementia Mother   . Colon cancer Father     Social History:  reports that she has quit smoking. Her smoking use included Cigarettes. She smoked 0 packs per day. She has never used smokeless tobacco. She reports that she drinks alcohol. She reports that she does not use illicit drugs.  Allergies:  Allergies  Allergen Reactions  . Doxycycline Swelling  . Other Other (See Comments)    Antibiotic that  starts with "e" does not know if it's erythromycin or not. She said she felt awful and it did not work.  . Dilantin (Phenytoin Sodium Extended) Rash    Medications: I have reviewed the patient's current medications.  Results for orders placed during the hospital encounter of 04/09/12 (from the past 48 hour(s))  CBC     Status: Abnormal   Collection Time   04/09/12  8:10 AM      Component Value Range Comment   WBC 13.8 (*) 4.0 - 10.5 (K/uL)    RBC 4.18  3.87 - 5.11 (MIL/uL)    Hemoglobin 13.6  12.0 - 15.0 (g/dL)    HCT 16.1  09.6 - 04.5 (%)    MCV 89.5  78.0 - 100.0 (fL)    MCH 32.5  26.0 - 34.0 (pg)    MCHC 36.4 (*) 30.0 - 36.0 (g/dL)    RDW 40.9  81.1 - 91.4 (%)    Platelets 319  150 - 400 (K/uL)   DIFFERENTIAL     Status: Abnormal   Collection Time   04/09/12  8:10 AM      Component Value Range Comment   Neutrophils Relative 90 (*) 43 -  77 (%)    Neutro Abs 12.5 (*) 1.7 - 7.7 (K/uL)    Lymphocytes Relative 4 (*) 12 - 46 (%)    Lymphs Abs 0.5 (*) 0.7 - 4.0 (K/uL)    Monocytes Relative 6  3 - 12 (%)    Monocytes Absolute 0.8  0.1 - 1.0 (K/uL)    Eosinophils Relative 0  0 - 5 (%)    Eosinophils Absolute 0.0  0.0 - 0.7 (K/uL)    Basophils Relative 0  0 - 1 (%)    Basophils Absolute 0.0  0.0 - 0.1 (K/uL)   COMPREHENSIVE METABOLIC PANEL     Status: Abnormal   Collection Time   04/09/12  8:10 AM      Component Value Range Comment   Sodium 124 (*) 135 - 145 (mEq/L)    Potassium 4.4  3.5 - 5.1 (mEq/L)    Chloride 84 (*) 96 - 112 (mEq/L)    CO2 24  19 - 32 (mEq/L)    Glucose, Bld 140 (*) 70 - 99 (mg/dL)    BUN 17  6 - 23 (mg/dL)    Creatinine, Ser 8.29  0.50 - 1.10 (mg/dL)    Calcium 9.8  8.4 - 10.5 (mg/dL)    Total Protein 8.2  6.0 - 8.3 (g/dL)    Albumin 4.1  3.5 - 5.2 (g/dL)    AST 27  0 - 37 (U/L) HEMOLYSIS AT THIS LEVEL MAY AFFECT RESULT   ALT 10  0 - 35 (U/L)    Alkaline Phosphatase 65  39 - 117 (U/L)    Total Bilirubin 0.7  0.3 - 1.2 (mg/dL)    GFR calc non Af Amer 79  (*) >90 (mL/min)    GFR calc Af Amer >90  >90 (mL/min)   POCT I-STAT TROPONIN I     Status: Normal   Collection Time   04/09/12  8:49 AM      Component Value Range Comment   Troponin i, poc 0.01  0.00 - 0.08 (ng/mL)    Comment 3            URINALYSIS, ROUTINE W REFLEX MICROSCOPIC     Status: Abnormal   Collection Time   04/09/12 10:12 AM      Component Value Range Comment   Color, Urine YELLOW  YELLOW     APPearance CLEAR  CLEAR     Specific Gravity, Urine 1.018  1.005 - 1.030     pH 5.5  5.0 - 8.0     Glucose, UA NEGATIVE  NEGATIVE (mg/dL)    Hgb urine dipstick NEGATIVE  NEGATIVE     Bilirubin Urine NEGATIVE  NEGATIVE     Ketones, ur 40 (*) NEGATIVE (mg/dL)    Protein, ur NEGATIVE  NEGATIVE (mg/dL)    Urobilinogen, UA 0.2  0.0 - 1.0 (mg/dL)    Nitrite NEGATIVE  NEGATIVE     Leukocytes, UA NEGATIVE  NEGATIVE  MICROSCOPIC NOT DONE ON URINES WITH NEGATIVE PROTEIN, BLOOD, LEUKOCYTES, NITRITE, OR GLUCOSE <1000 mg/dL.  CBC     Status: Abnormal   Collection Time   04/09/12  3:42 PM      Component Value Range Comment   WBC 10.5  4.0 - 10.5 (K/uL)    RBC 3.72 (*) 3.87 - 5.11 (MIL/uL)    Hemoglobin 11.8 (*) 12.0 - 15.0 (g/dL)    HCT 56.2 (*) 13.0 - 46.0 (%)    MCV 89.8  78.0 - 100.0 (fL)    MCH 31.7  26.0 - 34.0 (pg)    MCHC 35.3  30.0 - 36.0 (g/dL)    RDW 40.9  81.1 - 91.4 (%)    Platelets 287  150 - 400 (K/uL)   CREATININE, SERUM     Status: Abnormal   Collection Time   04/09/12  3:42 PM      Component Value Range Comment   Creatinine, Ser 0.62  0.50 - 1.10 (mg/dL)    GFR calc non Af Amer 84 (*) >90 (mL/min)    GFR calc Af Amer >90  >90 (mL/min)   SODIUM, URINE, RANDOM     Status: Normal   Collection Time   04/10/12  3:51 AM      Component Value Range Comment   Sodium, Ur 16     CREATININE, URINE, RANDOM     Status: Normal   Collection Time   04/10/12  3:51 AM      Component Value Range Comment   Creatinine, Urine 87.66     COMPREHENSIVE METABOLIC PANEL     Status: Abnormal    Collection Time   04/10/12  5:15 AM      Component Value Range Comment   Sodium 127 (*) 135 - 145 (mEq/L)    Potassium 4.0  3.5 - 5.1 (mEq/L)    Chloride 90 (*) 96 - 112 (mEq/L)    CO2 19  19 - 32 (mEq/L)    Glucose, Bld 91  70 - 99 (mg/dL)    BUN 13  6 - 23 (mg/dL)    Creatinine, Ser 7.82  0.50 - 1.10 (mg/dL)    Calcium 8.5  8.4 - 10.5 (mg/dL)    Total Protein 7.0  6.0 - 8.3 (g/dL)    Albumin 3.5  3.5 - 5.2 (g/dL)    AST 20  0 - 37 (U/L)    ALT 8  0 - 35 (U/L)    Alkaline Phosphatase 53  39 - 117 (U/L)    Total Bilirubin 0.6  0.3 - 1.2 (mg/dL)    GFR calc non Af Amer 86 (*) >90 (mL/min)    GFR calc Af Amer >90  >90 (mL/min)   CBC     Status: Abnormal   Collection Time   04/10/12  5:15 AM      Component Value Range Comment   WBC 10.3  4.0 - 10.5 (K/uL)    RBC 3.88  3.87 - 5.11 (MIL/uL)    Hemoglobin 12.1  12.0 - 15.0 (g/dL)    HCT 95.6 (*) 21.3 - 46.0 (%)    MCV 89.7  78.0 - 100.0 (fL)    MCH 31.2  26.0 - 34.0 (pg)    MCHC 34.8  30.0 - 36.0 (g/dL)    RDW 08.6  57.8 - 46.9 (%)    Platelets 313  150 - 400 (K/uL)     Dg Hip Complete Left  04/09/2012  *RADIOLOGY REPORT*  Clinical Data: Fall.  Left hip pain  LEFT HIP - COMPLETE 2+ VIEW  Comparison: 12/08/2011  Findings: Compression screw and plate fixation of the left femur. This fracture is healed.  No acute fracture on the left.  No significant degenerative change in the left hip.  Large soft tissue ossification lateral to the hip joint is unchanged.  Total hip replacement on the right is in satisfactory position and alignment.  Chronic fracture of the right inferior pubic ramus is unchanged.  Diffuse vascular calcification.  IMPRESSION: No acute fracture.  Original Report Authenticated By: Camelia Phenes,  M.D.   Ct Pelvis Wo Contrast  04/09/2012  *RADIOLOGY REPORT*  Clinical Data:  Left groin pain after fall.  CT PELVIS WITHOUT CONTRAST  Technique:  Multidetector CT imaging of the pelvis was performed following the standard protocol  without intravenous contrast.  Comparison:  Pelvis left hip radiographs 04/09/2012.  Pelvis radiograph 12/08/2011.  Findings:  The bones are markedly osteopenic.  There are postsurgical changes of  right hip arthroplasty, and there is a dynamic hip screw in the proximal left femur.  These metallic devices cause significant streak artifact through the bony pelvis. No evidence of hardware complication. Proximal left femur is intact.  The proximal right femur is intact. The superior pubic ramus is focally angulated, likely due to acute fracture on image number 14 of the coronal reformats.  A second fracture of the more distal superior pubic ramus, near the pubic symphysis, is a complete fracture without significant displacement seen on image #74 of series 2 (axials) and image #14 of series 5 (coronal images).  Additionally, there is an oblique fracture through the left inferior pubic ramus, on image #88 of series 2 9axial images).  There are two remote/chronic appearing fractures of the right inferior pubic ramus.  This prior fracture deformity is visualized on pelvis radiographs of January 2013. 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  There is approximately 3 mm anterolisthesis of L4 on L5, and 6 mm anterolisthesis of L5 on S1.  No acute sacral fracture is identified.  Atherosclerotic calcification of the distal abdominal aorta and iliac vasculature bilaterally.  Fluid-filled small bowel loops are within normal limits for caliber.  Urinary bladder difficult to visualize but grossly unremarkable.  No soft tissue mass or lymphadenopathy identified in the pelvis, with some limitation by marked streak artifact.  IMPRESSION:  1.  Acute, nondisplaced fractures of the left superior and inferior pubic rami as described above. 2.  Remote fracture deformities of the right inferior pubic ramus appear similar to the plain films of  January 2013.  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 3.  Right hip arthroplasty and left dynamic hip screw.  No hardware complication identified.  This hardware does cause marked streak artifact.  3.  Decreased bony mineralization.  Per CMS PQRS reporting requirements (PQRS Measure 24): Given the patient's age of greater than 50 and the fracture site (hip, distal radius, or spine), the patient should be tested for osteoporosis using DXA, and the appropriate treatment considered based on the DXA results.  Original Report Authenticated By: Britta Mccreedy, M.D.   Dg Abd Acute W/chest  04/09/2012  *RADIOLOGY REPORT*  Clinical Data: Vomiting, fall  ACUTE ABDOMEN SERIES (ABDOMEN 2 VIEW & CHEST 1 VIEW)  Comparison: Pelvic films 01/22 1013  Findings: Normal cardiac silhouette.  Lungs are clear.  No free air beneath hemidiaphragm.  There is severe erosive degenerative change of the left shoulder and severe arthropathy of the right shoulder.  No dilated loops of large or small bowel.  Lateral decubitus view demonstrates no intraperitoneal free air.  There are short air fluid levels on the decubitus view.  Small amount gas in the rectum. The right hip prosthetic and left internal fixation noted. It has a remote fracture of the right inferior pubic ramus.  IMPRESSION:  1.  Short air fluid levels within the bowel could represent ileus. 2.  No evidence of high-grade obstruction.  No intraperitoneal free air. 3.  No  evidence of acute fracture. 4.  No acute cardiopulmonary findings.  5.  Severe erosive degenerative change of the left shoulder.  Original Report Authenticated By: Genevive Bi, M.D.    Review of Systems  Constitutional: Negative.   HENT: Negative.   Eyes: Negative.   Respiratory: Negative.   Cardiovascular: Negative.   Gastrointestinal: Negative.   Genitourinary: Negative.   Musculoskeletal: Positive for joint pain and falls.  Neurological: Negative.   Endo/Heme/Allergies: Negative.     Psychiatric/Behavioral: Negative.    Blood pressure 142/80, pulse 100, temperature 98.4 F (36.9 Flores), temperature source Oral, resp. rate 18, height 4\' 6"  (1.372 m), weight 47.5 kg (104 lb 11.5 oz), SpO2 94.00%. Physical Exam  Constitutional: She is oriented to person, place, and time. She appears well-nourished.  HENT:  Head: Normocephalic.  Neck: Normal range of motion.  Cardiovascular: Normal rate.   Respiratory: Effort normal.  GI: Soft.  Musculoskeletal:       Pain left groin with palpation  Neurological: She is alert and oriented to person, place, and time.  Skin: Skin is warm and dry.  Psychiatric: She has a normal mood and affect.  No DVT  Assessment/Plan: Acute left pubic ramus fractures stable. Recommend WBAT with walker  with therapy. Prn analgesics. F/U xrays in two weeks.  [name] 04/10/2012, 12:52 PM

## 2012-04-11 DIAGNOSIS — E871 Hypo-osmolality and hyponatremia: Secondary | ICD-10-CM

## 2012-04-11 DIAGNOSIS — I10 Essential (primary) hypertension: Secondary | ICD-10-CM

## 2012-04-11 DIAGNOSIS — D7289 Other specified disorders of white blood cells: Secondary | ICD-10-CM

## 2012-04-11 DIAGNOSIS — S32509A Unspecified fracture of unspecified pubis, initial encounter for closed fracture: Secondary | ICD-10-CM

## 2012-04-11 LAB — BASIC METABOLIC PANEL
Calcium: 8 mg/dL — ABNORMAL LOW (ref 8.4–10.5)
Creatinine, Ser: 0.48 mg/dL — ABNORMAL LOW (ref 0.50–1.10)
GFR calc Af Amer: >90 mL/min (ref >90)
GFR calc non Af Amer: >90 mL/min (ref >90)
Sodium: 127 mEq/L — ABNORMAL LOW (ref 135–145)

## 2012-04-11 LAB — CLOSTRIDIUM DIFFICILE BY PCR: Toxigenic C. Difficile by PCR: NEGATIVE

## 2012-04-11 MED ORDER — SACCHAROMYCES BOULARDII 250 MG PO CAPS
250.0000 mg | ORAL_CAPSULE | Freq: Two times a day (BID) | ORAL | Status: DC
  Administered 2012-04-11 – 2012-04-12 (×3): 250 mg via ORAL
  Filled 2012-04-11 (×4): qty 1

## 2012-04-11 NOTE — Progress Notes (Signed)
[facility] PROGRESS NOTE  [name] JXB:[id number] DOB: 01/27/1931 DOA: 04/09/2012   Assessment/Plan: Patient Active Hospital Problem List: Pubic ramus fracture (04/09/2012) -continue narcotics. Minimal use.  -Appreciate or thrills assistance. -PT ambulated the patient. -Skilled nursing facility on Monday to  Leukocytosis (04/09/2012) -resolved.  Hyponatremia (04/09/2012) -improving patient to able to take orals. DC IV line.  HTN (hypertension) (04/09/2012) -blood pressure moderately high, continue Norvasc.  Rheumatoid arthritis (04/09/2012)  stable continue meds   Code Status: Full code  Family Communication: Daughter [phone]  Disposition Plan: SNF   Lambert Keto, MD  [facility] Pager [phone]  If 7PM-7AM, please contact night-coverage [email or web address] Password [facility] 04/11/2012, 11:31 AM   LOS: 2 days  Subjective: No complains  Objective: Filed Vitals:   04/10/12 1444 04/10/12 2118 04/11/12 0532 04/11/12 1107  BP: 124/78 115/68 136/73 147/74  Pulse: 96 76 78   Temp: 97.7 F (36.5 C) 98.5 F (36.9 C) 98 F (36.7 C)   TempSrc: Oral Oral Oral   Resp: 18 17 17    Height:      Weight:      SpO2: 97% 96% 94%     Intake/Output Summary (Last 24 hours) at 04/11/12 1131 Last data filed at 04/11/12 0355  Gross per 24 hour  Intake 3474.67 ml  Output    300 ml  Net 3174.67 ml   Weight change:   Exam:  General: Alert, awake, oriented x3, in no acute distress.  HEENT: No bruits, no goiter.  Heart: Regular rate and rhythm, without murmurs, rubs, gallops.  Lungs: Good air movement, bilateral air movement.  Abdomen: Soft, nontender, nondistended, positive bowel sounds.  Neuro: Grossly intact, nonfocal.   Data Reviewed: Basic Metabolic Panel:  Lab 04/11/12 4132 04/10/12 0515 04/09/12 1542 04/09/12 0810  NA 127* 127* -- 124*  K 3.6 4.0 -- --  CL 92* 90* -- 84*  CO2 22 19 -- 24  GLUCOSE 106* 91 -- 140*  BUN 10 13 -- 17    CREATININE 0.48* 0.56 0.62 0.74  CALCIUM 8.0* 8.5 -- 9.8  MG -- -- -- --  PHOS -- -- -- --   Liver Function Tests:  Lab 04/10/12 0515 04/09/12 0810  AST 20 27  ALT 8 10  ALKPHOS 53 65  BILITOT 0.6 0.7  PROT 7.0 8.2  ALBUMIN 3.5 4.1   No results found for this basename: LIPASE:5,AMYLASE:5 in the last 168 hours No results found for this basename: AMMONIA:5 in the last 168 hours CBC:  Lab 04/10/12 0515 04/09/12 1542 04/09/12 0810  WBC 10.3 10.5 13.8*  NEUTROABS -- -- 12.5*  HGB 12.1 11.8* 13.6  HCT 34.8* 33.4* 37.4  MCV 89.7 89.8 89.5  PLT 313 287 319   Cardiac Enzymes: No results found for this basename: CKTOTAL:5,CKMB:5,CKMBINDEX:5,TROPONINI:5 in the last 168 hours BNP: No components found with this basename: POCBNP:5 CBG: No results found for this basename: GLUCAP:5 in the last 168 hours  No results found for this or any previous visit (from the past 240 hour(s)).   Studies: Dg Hip Complete Left  04/09/2012  *RADIOLOGY REPORT*  Clinical Data: Fall.  Left hip pain  LEFT HIP - COMPLETE 2+ VIEW  Comparison: 12/08/2011  Findings: Compression screw and plate fixation of the left femur. This fracture is healed.  No acute fracture on the left.  No significant degenerative change in the left hip.  Large soft tissue ossification lateral to the hip joint is unchanged.  Total hip replacement on the right is  in satisfactory position and alignment.  Chronic fracture of the right inferior pubic ramus is unchanged.  Diffuse vascular calcification.  IMPRESSION: No acute fracture.  Original Report Authenticated By: Camelia Phenes, M.D.   Ct Pelvis Wo Contrast  04/09/2012  *RADIOLOGY REPORT*  Clinical Data:  Left groin pain after fall.  CT PELVIS WITHOUT CONTRAST  Technique:  Multidetector CT imaging of the pelvis was performed following the standard protocol without intravenous contrast.  Comparison:  Pelvis left hip radiographs 04/09/2012.  Pelvis radiograph 12/08/2011.  Findings:  The  bones are markedly osteopenic.  There are postsurgical changes of  right hip arthroplasty, and there is a dynamic hip screw in the proximal left femur.  These metallic devices cause significant streak artifact through the bony pelvis. No evidence of hardware complication. Proximal left femur is intact.  The proximal right femur is intact. The superior pubic ramus is focally angulated, likely due to acute fracture on image number 14 of the coronal reformats.  A second fracture of the more distal superior pubic ramus, near the pubic symphysis, is a complete fracture without significant displacement seen on image #74 of series 2 (axials) and image #14 of series 5 (coronal images).  Additionally, there is an oblique fracture through the left inferior pubic ramus, on image #88 of series 2 9axial images).  There are two remote/chronic appearing fractures of the right inferior pubic ramus.  This prior fracture deformity is visualized on pelvis radiographs of January 2013. 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  There is approximately 3 mm anterolisthesis of L4 on L5, and 6 mm anterolisthesis of L5 on S1.  No acute sacral fracture is identified.  Atherosclerotic calcification of the distal abdominal aorta and iliac vasculature bilaterally.  Fluid-filled small bowel loops are within normal limits for caliber.  Urinary bladder difficult to visualize but grossly unremarkable.  No soft tissue mass or lymphadenopathy identified in the pelvis, with some limitation by marked streak artifact.  IMPRESSION:  1.  Acute, nondisplaced fractures of the left superior and inferior pubic rami as described above. 2.  Remote fracture deformities of the right inferior pubic ramus appear similar to the plain films of January 2013.  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 3.  Right hip arthroplasty and left dynamic hip screw.  No hardware  complication identified.  This hardware does cause marked streak artifact.  3.  Decreased bony mineralization.  Per CMS PQRS reporting requirements (PQRS Measure 24): Given the patient's age of greater than 50 and the fracture site (hip, distal radius, or spine), the patient should be tested for osteoporosis using DXA, and the appropriate treatment considered based on the DXA results.  Original Report Authenticated By: Britta Mccreedy, M.D.   Dg Abd Acute W/chest  04/09/2012  *RADIOLOGY REPORT*  Clinical Data: Vomiting, fall  ACUTE ABDOMEN SERIES (ABDOMEN 2 VIEW & CHEST 1 VIEW)  Comparison: Pelvic films 01/22 1013  Findings: Normal cardiac silhouette.  Lungs are clear.  No free air beneath hemidiaphragm.  There is severe erosive degenerative change of the left shoulder and severe arthropathy of the right shoulder.  No dilated loops of large or small bowel.  Lateral decubitus view demonstrates no intraperitoneal free air.  There are short air fluid levels on the decubitus view.  Small amount gas in the rectum. The right hip prosthetic and left internal fixation noted. It has a remote fracture of the right  inferior pubic ramus.  IMPRESSION:  1.  Short air fluid levels within the bowel could represent ileus. 2.  No evidence of high-grade obstruction.  No intraperitoneal free air. 3.  No evidence of acute fracture. 4.  No acute cardiopulmonary findings.  5.  Severe erosive degenerative change of the left shoulder.  Original Report Authenticated By: Genevive Bi, M.D.    Scheduled Meds:    . adalimumab  40 mg Subcutaneous Weekly  . amLODipine  10 mg Oral Daily  . aspirin EC  162 mg Oral Daily  . azaTHIOprine  25 mg Oral Daily  . calcitonin (salmon)  1 spray Alternating Nares Daily  . cholecalciferol  1,000 Units Oral Daily  . heparin  5,000 Units Subcutaneous Q8H  . lisinopril  5 mg Oral Daily  . nitrofurantoin  50 mg Oral QHS  . NONFORMULARY OR COMPOUNDED ITEM   Topical BID  . pantoprazole  40 mg Oral  Q1200  . PHENobarbital  64.8 mg Oral BID  . predniSONE  5 mg Oral Q breakfast  . saccharomyces boulardii  250 mg Oral BID  . sodium chloride  3 mL Intravenous Q12H  . vitamin C  500 mg Oral Daily  . DISCONTD: cephALEXin  500 mg Oral Q8H  . DISCONTD: Cholecalciferol  1,000 Units Oral Daily  . DISCONTD: fluticasone   Topical BID  . DISCONTD: flyticasone   Topical BID  . DISCONTD: NON FORMULARY 1,300 mg  1,300 mg Oral Daily  . DISCONTD: NON FORMULARY   Topical BID  . DISCONTD: NONFORMULARY OR COMPOUNDED ITEM 1 each  1 each Oral Daily  . DISCONTD: PRESCRIPTION MEDICATION 1 application  1 application Topical BID   Continuous Infusions:    . sodium chloride 20 mL (04/10/12 1357)

## 2012-04-11 NOTE — Progress Notes (Signed)
Patient refused am administration of cephalexin stated she thought that was what was giving her diarrhea.  C.diff PCR results have not came back yet.  Will continue to monitor patient. Damichael Hofman, Joan Mayans, RN

## 2012-04-11 NOTE — Discharge Summary (Addendum)
Physician Discharge Summary  [name] MVH:[id number] DOB: [birth year] DOA: 04/09/2012  PCP: Ginette Otto, MD, MD  Admit date: 04/09/2012 Discharge date: 04/12/2012  Discharge Diagnoses:  Principal Problem:  *Pubic ramus fracture Active Problems:  Leukocytosis  Hyponatremia  HTN (hypertension)  Rheumatoid arthritis   Discharge Condition: Stable  Disposition:  Follow-up Information    Follow up with Ginette Otto, MD. [facility] followup and to)    Contact information:   [address] [place] [address] [phone]       Follow up with Javier Docker, MD. (Two-week followup with imaging)    Contact information:   [facility] [address], [address] [place] [address] [phone]       Please follow up. (BMET check on 04/14/12 to monitor sodium.)          History of present illness:  78 year old female with past medical history of rheumatoid arthritis and performed extremities, also multiple falls in the last 12 months who fell at home on the day of admission. She relates the pain as a 6/10 in her pelvic area, she relates after she fell she did not lose consciousness was able to walk to the bathroom. She thinks she tripped with something. When she went back to sit down she was unable to ambulate because of the pain. After this she called her daughter she was able to stand up but she cannot walk.  The patient denies fever, chills, numbness, dizziness, chest pain, unilateral weakness, diarrhea, or sick contacts.   Hospital Course:   *Pubic ramus fracture: She was admitted to the hospital start her on IV fluids and IV pain medication with was consulted they recommended followup with imaging in 2 weeks and weight-bearing as tolerated. Physical therapy saw her recommended skilled nursing facility.  Leukocytosis Secondary to stress emargination this resolved, the patient remained  afebrile.   Hyponatremia Probably secondary to dehydration this resolved with IV fluids.  Will request a bmet be checked at the facility on Wednesday 6/5 to ensure her sodium is continuing to improve.   HTN (hypertension) No changes were made   Rheumatoid arthritis No changes were  Discharge Exam: Filed Vitals:   04/12/12 0942  BP: 112/73  Pulse:   Temp:   Resp:    Filed Vitals:   04/11/12 1425 04/11/12 2046 04/12/12 0549 04/12/12 0942  BP: 104/64 104/63 127/71 112/73  Pulse: 76 78 72   Temp: 98.3 F (36.8 C) 98 F (36.7 C) 97.7 F (36.5 C)   TempSrc: Oral Oral Oral   Resp: 16 16 17    Height:      Weight:      SpO2: 91% 96% 97%    General: Alert, awake, oriented x3, in no acute distress.  HEENT: No bruits, no goiter.  Heart: Regular rate and rhythm, without murmurs, rubs, gallops.  Lungs: Good air movement, bilateral air movement.  Abdomen: Soft, nontender, nondistended, positive bowel sounds.  Neuro: Grossly intact, nonfocal.   Discharge Instructions  Discharge Orders    Future Orders Please Complete By Expires   Diet - low sodium heart healthy      Increase activity slowly        Medication List  As of 04/12/2012 11:23 AM   TAKE these medications         amLODipine 10 MG tablet   Commonly known as: NORVASC   Take 10 mg by mouth daily.      aspirin EC 81 MG  tablet   Take 162 mg by mouth daily as needed. For pain      azaTHIOprine 50 MG tablet   Commonly known as: IMURAN   Take 25 mg by mouth daily.      calcitonin (salmon) 200 UNIT/ACT nasal spray   Commonly known as: MIACALCIN/FORTICAL   Place 1 spray into the nose daily.      CRANBERRY PO   Take 1 tablet by mouth 2 (two) times daily.      D3-1000 1000 UNITS capsule   Generic drug: Cholecalciferol   Take 1,000 Units by mouth daily.      FLUTICASONE PROPIONATE EX   Apply 1 application topically daily as needed. For inflamation      Glucosamine-Chondroitin Caps   Take 1 capsule by mouth 2  (two) times daily.      HUMIRA 40 MG/0.8ML injection   Generic drug: adalimumab   Inject 40 mg into the skin once a week.      L-Carnitine 250 MG Caps   Take 1 capsule by mouth daily.      L-Glutamic Acid 500 MG Tabs   Take 1 tablet by mouth daily.      lisinopril 5 MG tablet   Commonly known as: PRINIVIL,ZESTRIL   Take 5 mg by mouth daily.      loratadine 10 MG tablet   Commonly known as: CLARITIN   Take 10 mg by mouth at bedtime.      Milk Thistle 150 MG Caps   Take 1 capsule by mouth daily.      mulitivitamin with minerals Tabs   Take 1 tablet by mouth 3 (three) times daily.      nitrofurantoin 50 MG capsule   Commonly known as: MACRODANTIN   Take 50 mg by mouth at bedtime.      OVER THE COUNTER MEDICATION   Take 1 capsule by mouth 2 (two) times daily. Ace's vitamin A-C-E with Selenium      OVER THE COUNTER MEDICATION   Take 1-2 tablets by mouth 3 (three) times daily. Take 2 tablets in AM, 1 tablet at Noon, and 2 tablets in PM  Calcium-Magnesium-Vitamin D supplement      OVER THE COUNTER MEDICATION   Take 2 capsules by mouth 4 (four) times daily as needed. For acid reflux   Mega-zyme digestive enzyme      OVER THE COUNTER MEDICATION   Take 1 tablet by mouth daily as needed. For restless leg syndrom   Magnesium malate      PHENobarbital 64.8 MG tablet   Commonly known as: LUMINAL   Take 64.8 mg by mouth 2 (two) times daily.      predniSONE 5 MG tablet   Commonly known as: DELTASONE   Take 5 mg by mouth daily as needed. For rheumatoid flare-ups      vitamin C 500 MG tablet   Commonly known as: ASCORBIC ACID   Take 500 mg by mouth daily.              The results of significant diagnostics from this hospitalization (including imaging, microbiology, ancillary and laboratory) are listed below for reference.    Significant Diagnostic Studies: Dg Hip Complete Left  04/09/2012  *RADIOLOGY REPORT*  Clinical Data: Fall.  Left hip pain  LEFT HIP - COMPLETE 2+  VIEW  Comparison: 12/08/2011  Findings: Compression screw and plate fixation of the left femur. This fracture is healed.  No acute fracture on the left.  No significant degenerative  change in the left hip.  Large soft tissue ossification lateral to the hip joint is unchanged.  Total hip replacement on the right is in satisfactory position and alignment.  Chronic fracture of the right inferior pubic ramus is unchanged.  Diffuse vascular calcification.  IMPRESSION: No acute fracture.  Original Report Authenticated By: Camelia Phenes, M.D.   Ct Pelvis Wo Contrast  04/09/2012  *RADIOLOGY REPORT*  Clinical Data:  Left groin pain after fall.  CT PELVIS WITHOUT CONTRAST  Technique:  Multidetector CT imaging of the pelvis was performed following the standard protocol without intravenous contrast.  Comparison:  Pelvis left hip radiographs 04/09/2012.  Pelvis radiograph 12/08/2011.  Findings:  The bones are markedly osteopenic.  There are postsurgical changes of  right hip arthroplasty, and there is a dynamic hip screw in the proximal left femur.  These metallic devices cause significant streak artifact through the bony pelvis. No evidence of hardware complication. Proximal left femur is intact.  The proximal right femur is intact. The superior pubic ramus is focally angulated, likely due to acute fracture on image number 14 of the coronal reformats.  A second fracture of the more distal superior pubic ramus, near the pubic symphysis, is a complete fracture without significant displacement seen on image #74 of series 2 (axials) and image #14 of series 5 (coronal images).  Additionally, there is an oblique fracture through the left inferior pubic ramus, on image #88 of series 2 9axial images).  There are two remote/chronic appearing fractures of the right inferior pubic ramus.  This prior fracture deformity is visualized on pelvis radiographs of January 2013. 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  There is approximately 3 mm anterolisthesis of L4 on L5, and 6 mm anterolisthesis of L5 on S1.  No acute sacral fracture is identified.  Atherosclerotic calcification of the distal abdominal aorta and iliac vasculature bilaterally.  Fluid-filled small bowel loops are within normal limits for caliber.  Urinary bladder difficult to visualize but grossly unremarkable.  No soft tissue mass or lymphadenopathy identified in the pelvis, with some limitation by marked streak artifact.  IMPRESSION:  1.  Acute, nondisplaced fractures of the left superior and inferior pubic rami as described above. 2.  Remote fracture deformities of the right inferior pubic ramus appear similar to the plain films of January 2013.  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 3.  Right hip arthroplasty and left dynamic hip screw.  No hardware complication identified.  This hardware does cause marked streak artifact.  3.  Decreased bony mineralization.  Per CMS PQRS reporting requirements (PQRS Measure 24): Given the patient's age of greater than 50 and the fracture site (hip, distal radius, or spine), the patient should be tested for osteoporosis using DXA, and the appropriate treatment considered based on the DXA results.  Original Report Authenticated By: Britta Mccreedy, M.D.   Dg Abd Acute W/chest  04/09/2012  *RADIOLOGY REPORT*  Clinical Data: Vomiting, fall  ACUTE ABDOMEN SERIES (ABDOMEN 2 VIEW & CHEST 1 VIEW)  Comparison: Pelvic films 01/22 1013  Findings: Normal cardiac silhouette.  Lungs are clear.  No free air beneath hemidiaphragm.  There is severe erosive degenerative change of the left shoulder and severe arthropathy of the right shoulder.  No dilated loops of large or small bowel.  Lateral decubitus view demonstrates no intraperitoneal free air.  There are short air fluid levels on the decubitus view.  Small amount gas in the rectum. The right hip  prosthetic and left internal fixation noted. It has a remote fracture of the right inferior pubic ramus.  IMPRESSION:  1.  Short air fluid levels within the bowel could represent ileus. 2.  No evidence of high-grade obstruction.  No intraperitoneal free air. 3.  No evidence of acute fracture. 4.  No acute cardiopulmonary findings.  5.  Severe erosive degenerative change of the left shoulder.  Original Report Authenticated By: Genevive Bi, M.D.    Microbiology: Recent Results (from the past 240 hour(s))  CLOSTRIDIUM DIFFICILE BY PCR     Status: Normal   Collection Time   04/11/12  3:26 AM      Component Value Range Status Comment   C difficile by pcr NEGATIVE  NEGATIVE  Final      Labs: Basic Metabolic Panel:  Lab 04/11/12 4782 04/10/12 0515 04/09/12 1542 04/09/12 0810  NA 127* 127* -- 124*  K 3.6 4.0 -- --  CL 92* 90* -- 84*  CO2 22 19 -- 24  GLUCOSE 106* 91 -- 140*  BUN 10 13 -- 17  CREATININE 0.48* 0.56 0.62 0.74  CALCIUM 8.0* 8.5 -- 9.8  MG -- -- -- --  PHOS -- -- -- --   Liver Function Tests:  Lab 04/10/12 0515 04/09/12 0810  AST 20 27  ALT 8 10  ALKPHOS 53 65  BILITOT 0.6 0.7  PROT 7.0 8.2  ALBUMIN 3.5 4.1  CBC:  Lab 04/10/12 0515 04/09/12 1542 04/09/12 0810  WBC 10.3 10.5 13.8*  NEUTROABS -- -- 12.5*  HGB 12.1 11.8* 13.6  HCT 34.8* 33.4* 37.4  MCV 89.7 89.8 89.5  PLT 313 287 319    Time coordinating discharge: Greater than 35 minutes  Signed:  Stephani Police  [facility] 04/12/2012, 11:23 AM

## 2012-04-11 NOTE — Progress Notes (Signed)
Clinical Social Work Department BRIEF PSYCHOSOCIAL ASSESSMENT 04/11/2012  Patient:  [name],[name]     Account Number:  [id number]     Admit date:  04/09/2012  Clinical Social Worker:  Thomasene Mohair  Date/Time:  04/11/2012 11:30 AM  Referred by:  Physician  Date Referred:  04/09/2012 Referred for  SNF Placement   Other Referral:   Interview type:  Patient Other interview type:   family    PSYCHOSOCIAL DATA Living Status:  ALONE Admitted from facility:   Level of care:   Primary support name:  Steward Drone Studt/[phone] Primary support relationship to patient:  CHILD, ADULT Degree of support available:   strong support    CURRENT CONCERNS Current Concerns  Post-Acute Placement   Other Concerns:    SOCIAL WORK ASSESSMENT / PLAN Clinical Social Worker was referred to Pt for discharge planning to SNF.  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 She is widowed, lives alone in an apartment in [place]. Pt is active at baseline, enjoys reading and going for walks. She is agreeable [facility].  CSW met with Pt while daughter was at bedside. Pt request that the CSW f/u with the daugther with bed offers. CSW discussed with family that Pt will likely be ready for DC Monday.  Pt and family agreeable to selecting SNF quickly and have already visited some of the ones in the area.  CSW will send Pt's information to area SNFs.   Assessment/plan status:  Other - See comment Other assessment/ plan:   pursue SNF placement   Information/referral to community resources:    PATIENT'S/FAMILY'S RESPONSE TO PLAN OF CARE: Pt and family agreeable to plan. Pt's daughter has begun visiting SNFs and has made Flores list of preferences if there is availability. Pt is vegetarian and would like to have Flores snf that can accomodate her daughter bringing in food or provide meals that fit her diet.   Frederico Hamman, LCSW [phone]  Weekend coverage

## 2012-04-12 DIAGNOSIS — Z9181 History of falling: Secondary | ICD-10-CM | POA: Diagnosis not present

## 2012-04-12 DIAGNOSIS — S3289XA Fracture of other parts of pelvis, initial encounter for closed fracture: Secondary | ICD-10-CM | POA: Diagnosis not present

## 2012-04-12 DIAGNOSIS — M069 Rheumatoid arthritis, unspecified: Secondary | ICD-10-CM | POA: Diagnosis not present

## 2012-04-12 DIAGNOSIS — E871 Hypo-osmolality and hyponatremia: Secondary | ICD-10-CM

## 2012-04-12 DIAGNOSIS — R279 Unspecified lack of coordination: Secondary | ICD-10-CM | POA: Diagnosis not present

## 2012-04-12 DIAGNOSIS — IMO0001 Reserved for inherently not codable concepts without codable children: Secondary | ICD-10-CM | POA: Diagnosis not present

## 2012-04-12 DIAGNOSIS — M6281 Muscle weakness (generalized): Secondary | ICD-10-CM | POA: Diagnosis not present

## 2012-04-12 DIAGNOSIS — Z5189 Encounter for other specified aftercare: Secondary | ICD-10-CM | POA: Diagnosis not present

## 2012-04-12 DIAGNOSIS — I10 Essential (primary) hypertension: Secondary | ICD-10-CM

## 2012-04-12 DIAGNOSIS — R569 Unspecified convulsions: Secondary | ICD-10-CM | POA: Diagnosis not present

## 2012-04-12 DIAGNOSIS — S32509A Unspecified fracture of unspecified pubis, initial encounter for closed fracture: Secondary | ICD-10-CM | POA: Diagnosis not present

## 2012-04-12 DIAGNOSIS — D7289 Other specified disorders of white blood cells: Secondary | ICD-10-CM

## 2012-04-12 DIAGNOSIS — M810 Age-related osteoporosis without current pathological fracture: Secondary | ICD-10-CM | POA: Diagnosis not present

## 2012-04-12 DIAGNOSIS — Z4789 Encounter for other orthopedic aftercare: Secondary | ICD-10-CM | POA: Diagnosis not present

## 2012-04-12 DIAGNOSIS — K219 Gastro-esophageal reflux disease without esophagitis: Secondary | ICD-10-CM | POA: Diagnosis not present

## 2012-04-12 NOTE — Progress Notes (Addendum)
Clinical Social Worker contacted pt daughter at pt request to provide SNF bed offers. Pt daughter first choice is [facility] and second choice is [facility]. [facility] has offered a bed and [facility] has not yet responded. Clinical Social Worker contacted [facility] and left message to determine if facility able to accept pt today. Clinical Social Worker left message with [facility] to determine if facility able to offer a bed. Clinical Social Worker will update pt daughter once return phone calls are received from facility. Per MD, pt medically stable for discharge today. Clinical Social Worker to facilitate pt discharge needs.   Addendum 10:33 am- Clinical Child psychotherapist received return phone call from [facility]. Facility is able to offer a semi-private room for pt available today. Clinical Social Worker contacted pt daughter to discuss and ensure pt would be agreeable to admitting into a semi-private room and pt daughter spoke to pt who is accepting of bed offer at [facility]. Clinical Social Worker notified facility. Pt daughter plans to complete admission paperwork at 2 pm and clinical Social Worker to facilitate pt discharge needs at that time this afternoon.   Jacklynn Lewis, MSW, LCSWA  Clinical Social Work [phone]

## 2012-04-12 NOTE — Progress Notes (Signed)
PT Cancellation Note  Treatment cancelled today due to pt currently leaving for SNF.Marland Kitchen  Permelia Bamba 04/12/2012, 2:34 PM

## 2012-04-12 NOTE — Progress Notes (Signed)
Clinical Social Work Department CLINICAL SOCIAL WORK PLACEMENT NOTE 04/12/2012  Patient:  [name], [name]  Account Number:  [id number] Admit date:  04/09/2012  Clinical Social Worker:  Frederico Hamman, LCSW  Date/time:  04/11/2012 11:30 AM  Clinical Social Work is seeking post-discharge placement for this patient at the following level of care:   SKILLED NURSING   (*CSW will update this form in Epic as items are completed)   04/11/2012  Patient/family provided with [facility] Department of Clinical Social Work's list of facilities offering this level of care within the geographic area requested by the patient (or if unable, by the patient's family).  04/11/2012  Patient/family informed of their freedom to choose among providers that offer the needed level of care, that participate in Medicare, Medicaid or managed care program needed by the patient, have an available bed and are willing to accept the patient.  04/11/2012  Patient/family informed of [facility]' ownership interest in [facility], as well as of the fact that they are under no obligation to receive care at this facility.  PASARR submitted to EDS on 04/11/2012 PASARR number received from EDS on 04/12/2012  FL2 transmitted to all facilities in geographic area requested by pt/family on  04/11/2012 FL2 transmitted to all facilities within larger geographic area on   Patient informed that his/her managed care company has contracts with or will negotiate with  certain facilities, including the following:     Patient/family informed of bed offers received:  04/12/2012 ([facility]) Patient chooses bed at [facility] ([facility]) Physician recommends and patient chooses bed at    Patient to be transferred to [facility] on  04/12/2012 ([facility]) Patient to be transferred to facility by non-emergency ambulance ([facility])  The following physician request were entered in Epic:   Additional  Comments:    Jacklynn Lewis, MSW, LCSWA  Clinical Social Work [phone]

## 2012-04-12 NOTE — Care Management Note (Signed)
    Page 1 of 1   04/12/2012     5:07:40 PM   CARE MANAGEMENT NOTE 04/12/2012  Patient:  [name],[name]   Account Number:  [id number]  Date Initiated:  04/12/2012  Documentation initiated by:  Letha Cape  Subjective/Objective Assessment:   dx pubic rams fx  admit-lives alone.     Action/Plan:   pt eval- recs snf   Anticipated DC Date:  04/12/2012   Anticipated DC Plan:  SKILLED NURSING FACILITY  In-house referral  Clinical Social Worker      DC Planning Services  CM consult      Choice offered to / List presented to:             Status of service:  Completed, signed off Medicare Important Message given?   (If response is "NO", the following Medicare IM given date fields will be blank) Date Medicare IM given:   Date Additional Medicare IM given:    Discharge Disposition:  SKILLED NURSING FACILITY  Per UR Regulation:  Reviewed for med. necessity/level of care/duration of stay  If discussed at Long Length of Stay Meetings, dates discussed:    Comments:  04/12/12 17:06 Letha Cape RN, BSN [phone] patient lives alone, physical therapy recs snf, CSW referral.  Patient dc to snf today.

## 2012-04-12 NOTE — Progress Notes (Signed)
Clinical Social Worker facilitated pt discharge needs including contacting facility, pt daughter via telephone, and arranging ambulance transportation for pt to [facility]. No further social work needs at this time.  Jacklynn Lewis, MSW, LCSWA  Clinical Social Work [phone]

## 2012-04-12 NOTE — Progress Notes (Signed)
Pt discharged to [facility] per MD order, report called to RN.  IV removed, dressing and pressure applied, site unremarkable.  Home medications retrieved from pharmacy and returned to patient.  Pt transported by [facility] to facility.

## 2012-04-14 DIAGNOSIS — IMO0001 Reserved for inherently not codable concepts without codable children: Secondary | ICD-10-CM | POA: Diagnosis not present

## 2012-04-14 DIAGNOSIS — I10 Essential (primary) hypertension: Secondary | ICD-10-CM | POA: Diagnosis not present

## 2012-04-14 DIAGNOSIS — M069 Rheumatoid arthritis, unspecified: Secondary | ICD-10-CM | POA: Diagnosis not present

## 2012-04-14 DIAGNOSIS — E871 Hypo-osmolality and hyponatremia: Secondary | ICD-10-CM | POA: Diagnosis not present

## 2012-04-14 DIAGNOSIS — Z9181 History of falling: Secondary | ICD-10-CM | POA: Diagnosis not present

## 2012-04-14 DIAGNOSIS — K219 Gastro-esophageal reflux disease without esophagitis: Secondary | ICD-10-CM | POA: Diagnosis not present

## 2012-04-14 DIAGNOSIS — R569 Unspecified convulsions: Secondary | ICD-10-CM | POA: Diagnosis not present

## 2012-04-14 DIAGNOSIS — M810 Age-related osteoporosis without current pathological fracture: Secondary | ICD-10-CM | POA: Diagnosis not present

## 2012-04-25 DIAGNOSIS — M069 Rheumatoid arthritis, unspecified: Secondary | ICD-10-CM | POA: Diagnosis not present

## 2012-04-25 DIAGNOSIS — R569 Unspecified convulsions: Secondary | ICD-10-CM | POA: Diagnosis not present

## 2012-04-25 DIAGNOSIS — I10 Essential (primary) hypertension: Secondary | ICD-10-CM | POA: Diagnosis not present

## 2012-04-25 DIAGNOSIS — IMO0001 Reserved for inherently not codable concepts without codable children: Secondary | ICD-10-CM | POA: Diagnosis not present

## 2012-04-25 DIAGNOSIS — R269 Unspecified abnormalities of gait and mobility: Secondary | ICD-10-CM | POA: Diagnosis not present

## 2012-04-25 DIAGNOSIS — Z9181 History of falling: Secondary | ICD-10-CM | POA: Diagnosis not present

## 2012-04-25 DIAGNOSIS — IMO0002 Reserved for concepts with insufficient information to code with codable children: Secondary | ICD-10-CM | POA: Diagnosis not present

## 2012-04-25 DIAGNOSIS — Z87440 Personal history of urinary (tract) infections: Secondary | ICD-10-CM | POA: Diagnosis not present

## 2012-04-26 DIAGNOSIS — S329XXA Fracture of unspecified parts of lumbosacral spine and pelvis, initial encounter for closed fracture: Secondary | ICD-10-CM | POA: Diagnosis not present

## 2012-04-27 DIAGNOSIS — IMO0001 Reserved for inherently not codable concepts without codable children: Secondary | ICD-10-CM | POA: Diagnosis not present

## 2012-04-27 DIAGNOSIS — M069 Rheumatoid arthritis, unspecified: Secondary | ICD-10-CM | POA: Diagnosis not present

## 2012-04-27 DIAGNOSIS — Z9181 History of falling: Secondary | ICD-10-CM | POA: Diagnosis not present

## 2012-04-27 DIAGNOSIS — R269 Unspecified abnormalities of gait and mobility: Secondary | ICD-10-CM | POA: Diagnosis not present

## 2012-04-27 DIAGNOSIS — I10 Essential (primary) hypertension: Secondary | ICD-10-CM | POA: Diagnosis not present

## 2012-04-27 DIAGNOSIS — R569 Unspecified convulsions: Secondary | ICD-10-CM | POA: Diagnosis not present

## 2012-04-28 DIAGNOSIS — Z9181 History of falling: Secondary | ICD-10-CM | POA: Diagnosis not present

## 2012-04-28 DIAGNOSIS — M069 Rheumatoid arthritis, unspecified: Secondary | ICD-10-CM | POA: Diagnosis not present

## 2012-04-28 DIAGNOSIS — IMO0001 Reserved for inherently not codable concepts without codable children: Secondary | ICD-10-CM | POA: Diagnosis not present

## 2012-04-28 DIAGNOSIS — R569 Unspecified convulsions: Secondary | ICD-10-CM | POA: Diagnosis not present

## 2012-04-28 DIAGNOSIS — I10 Essential (primary) hypertension: Secondary | ICD-10-CM | POA: Diagnosis not present

## 2012-04-28 DIAGNOSIS — R269 Unspecified abnormalities of gait and mobility: Secondary | ICD-10-CM | POA: Diagnosis not present

## 2012-04-30 DIAGNOSIS — Z9181 History of falling: Secondary | ICD-10-CM | POA: Diagnosis not present

## 2012-04-30 DIAGNOSIS — R269 Unspecified abnormalities of gait and mobility: Secondary | ICD-10-CM | POA: Diagnosis not present

## 2012-04-30 DIAGNOSIS — M069 Rheumatoid arthritis, unspecified: Secondary | ICD-10-CM | POA: Diagnosis not present

## 2012-04-30 DIAGNOSIS — R569 Unspecified convulsions: Secondary | ICD-10-CM | POA: Diagnosis not present

## 2012-04-30 DIAGNOSIS — IMO0001 Reserved for inherently not codable concepts without codable children: Secondary | ICD-10-CM | POA: Diagnosis not present

## 2012-04-30 DIAGNOSIS — I10 Essential (primary) hypertension: Secondary | ICD-10-CM | POA: Diagnosis not present

## 2012-05-04 DIAGNOSIS — IMO0001 Reserved for inherently not codable concepts without codable children: Secondary | ICD-10-CM | POA: Diagnosis not present

## 2012-05-04 DIAGNOSIS — R269 Unspecified abnormalities of gait and mobility: Secondary | ICD-10-CM | POA: Diagnosis not present

## 2012-05-04 DIAGNOSIS — M069 Rheumatoid arthritis, unspecified: Secondary | ICD-10-CM | POA: Diagnosis not present

## 2012-05-04 DIAGNOSIS — Z9181 History of falling: Secondary | ICD-10-CM | POA: Diagnosis not present

## 2012-05-04 DIAGNOSIS — R569 Unspecified convulsions: Secondary | ICD-10-CM | POA: Diagnosis not present

## 2012-05-04 DIAGNOSIS — I10 Essential (primary) hypertension: Secondary | ICD-10-CM | POA: Diagnosis not present

## 2012-05-05 ENCOUNTER — Other Ambulatory Visit: Payer: Self-pay | Admitting: Dermatology

## 2012-05-05 DIAGNOSIS — C44320 Squamous cell carcinoma of skin of unspecified parts of face: Secondary | ICD-10-CM | POA: Diagnosis not present

## 2012-05-05 DIAGNOSIS — L821 Other seborrheic keratosis: Secondary | ICD-10-CM | POA: Diagnosis not present

## 2012-05-05 DIAGNOSIS — D485 Neoplasm of uncertain behavior of skin: Secondary | ICD-10-CM | POA: Diagnosis not present

## 2012-05-06 DIAGNOSIS — Z79899 Other long term (current) drug therapy: Secondary | ICD-10-CM | POA: Diagnosis not present

## 2012-05-06 DIAGNOSIS — I10 Essential (primary) hypertension: Secondary | ICD-10-CM | POA: Diagnosis not present

## 2012-05-08 DIAGNOSIS — I10 Essential (primary) hypertension: Secondary | ICD-10-CM | POA: Diagnosis not present

## 2012-05-08 DIAGNOSIS — R269 Unspecified abnormalities of gait and mobility: Secondary | ICD-10-CM | POA: Diagnosis not present

## 2012-05-08 DIAGNOSIS — R569 Unspecified convulsions: Secondary | ICD-10-CM | POA: Diagnosis not present

## 2012-05-08 DIAGNOSIS — Z9181 History of falling: Secondary | ICD-10-CM | POA: Diagnosis not present

## 2012-05-08 DIAGNOSIS — M069 Rheumatoid arthritis, unspecified: Secondary | ICD-10-CM | POA: Diagnosis not present

## 2012-05-08 DIAGNOSIS — IMO0001 Reserved for inherently not codable concepts without codable children: Secondary | ICD-10-CM | POA: Diagnosis not present

## 2012-05-10 DIAGNOSIS — R269 Unspecified abnormalities of gait and mobility: Secondary | ICD-10-CM | POA: Diagnosis not present

## 2012-05-10 DIAGNOSIS — M069 Rheumatoid arthritis, unspecified: Secondary | ICD-10-CM | POA: Diagnosis not present

## 2012-05-10 DIAGNOSIS — IMO0001 Reserved for inherently not codable concepts without codable children: Secondary | ICD-10-CM | POA: Diagnosis not present

## 2012-05-10 DIAGNOSIS — Z9181 History of falling: Secondary | ICD-10-CM | POA: Diagnosis not present

## 2012-05-10 DIAGNOSIS — R569 Unspecified convulsions: Secondary | ICD-10-CM | POA: Diagnosis not present

## 2012-05-10 DIAGNOSIS — I10 Essential (primary) hypertension: Secondary | ICD-10-CM | POA: Diagnosis not present

## 2012-05-11 DIAGNOSIS — I10 Essential (primary) hypertension: Secondary | ICD-10-CM | POA: Diagnosis not present

## 2012-05-11 DIAGNOSIS — IMO0001 Reserved for inherently not codable concepts without codable children: Secondary | ICD-10-CM | POA: Diagnosis not present

## 2012-05-11 DIAGNOSIS — R569 Unspecified convulsions: Secondary | ICD-10-CM | POA: Diagnosis not present

## 2012-05-11 DIAGNOSIS — R269 Unspecified abnormalities of gait and mobility: Secondary | ICD-10-CM | POA: Diagnosis not present

## 2012-05-11 DIAGNOSIS — Z9181 History of falling: Secondary | ICD-10-CM | POA: Diagnosis not present

## 2012-05-11 DIAGNOSIS — M069 Rheumatoid arthritis, unspecified: Secondary | ICD-10-CM | POA: Diagnosis not present

## 2012-05-15 DIAGNOSIS — M069 Rheumatoid arthritis, unspecified: Secondary | ICD-10-CM | POA: Diagnosis not present

## 2012-05-15 DIAGNOSIS — R269 Unspecified abnormalities of gait and mobility: Secondary | ICD-10-CM | POA: Diagnosis not present

## 2012-05-15 DIAGNOSIS — Z9181 History of falling: Secondary | ICD-10-CM | POA: Diagnosis not present

## 2012-05-15 DIAGNOSIS — I10 Essential (primary) hypertension: Secondary | ICD-10-CM | POA: Diagnosis not present

## 2012-05-15 DIAGNOSIS — IMO0001 Reserved for inherently not codable concepts without codable children: Secondary | ICD-10-CM | POA: Diagnosis not present

## 2012-05-15 DIAGNOSIS — R569 Unspecified convulsions: Secondary | ICD-10-CM | POA: Diagnosis not present

## 2012-05-17 DIAGNOSIS — R269 Unspecified abnormalities of gait and mobility: Secondary | ICD-10-CM | POA: Diagnosis not present

## 2012-05-17 DIAGNOSIS — Z9181 History of falling: Secondary | ICD-10-CM | POA: Diagnosis not present

## 2012-05-17 DIAGNOSIS — I10 Essential (primary) hypertension: Secondary | ICD-10-CM | POA: Diagnosis not present

## 2012-05-17 DIAGNOSIS — IMO0001 Reserved for inherently not codable concepts without codable children: Secondary | ICD-10-CM | POA: Diagnosis not present

## 2012-05-17 DIAGNOSIS — M069 Rheumatoid arthritis, unspecified: Secondary | ICD-10-CM | POA: Diagnosis not present

## 2012-05-17 DIAGNOSIS — R569 Unspecified convulsions: Secondary | ICD-10-CM | POA: Diagnosis not present

## 2012-05-18 DIAGNOSIS — R269 Unspecified abnormalities of gait and mobility: Secondary | ICD-10-CM | POA: Diagnosis not present

## 2012-05-18 DIAGNOSIS — I10 Essential (primary) hypertension: Secondary | ICD-10-CM | POA: Diagnosis not present

## 2012-05-18 DIAGNOSIS — M069 Rheumatoid arthritis, unspecified: Secondary | ICD-10-CM | POA: Diagnosis not present

## 2012-05-18 DIAGNOSIS — Z9181 History of falling: Secondary | ICD-10-CM | POA: Diagnosis not present

## 2012-05-18 DIAGNOSIS — R569 Unspecified convulsions: Secondary | ICD-10-CM | POA: Diagnosis not present

## 2012-05-18 DIAGNOSIS — IMO0001 Reserved for inherently not codable concepts without codable children: Secondary | ICD-10-CM | POA: Diagnosis not present

## 2012-05-20 DIAGNOSIS — I10 Essential (primary) hypertension: Secondary | ICD-10-CM | POA: Diagnosis not present

## 2012-05-20 DIAGNOSIS — M069 Rheumatoid arthritis, unspecified: Secondary | ICD-10-CM | POA: Diagnosis not present

## 2012-05-20 DIAGNOSIS — R269 Unspecified abnormalities of gait and mobility: Secondary | ICD-10-CM | POA: Diagnosis not present

## 2012-05-20 DIAGNOSIS — IMO0001 Reserved for inherently not codable concepts without codable children: Secondary | ICD-10-CM | POA: Diagnosis not present

## 2012-05-20 DIAGNOSIS — Z9181 History of falling: Secondary | ICD-10-CM | POA: Diagnosis not present

## 2012-05-20 DIAGNOSIS — R569 Unspecified convulsions: Secondary | ICD-10-CM | POA: Diagnosis not present

## 2012-05-21 DIAGNOSIS — E871 Hypo-osmolality and hyponatremia: Secondary | ICD-10-CM | POA: Diagnosis not present

## 2012-05-25 DIAGNOSIS — R269 Unspecified abnormalities of gait and mobility: Secondary | ICD-10-CM | POA: Diagnosis not present

## 2012-05-25 DIAGNOSIS — M069 Rheumatoid arthritis, unspecified: Secondary | ICD-10-CM | POA: Diagnosis not present

## 2012-05-25 DIAGNOSIS — R569 Unspecified convulsions: Secondary | ICD-10-CM | POA: Diagnosis not present

## 2012-05-25 DIAGNOSIS — IMO0001 Reserved for inherently not codable concepts without codable children: Secondary | ICD-10-CM | POA: Diagnosis not present

## 2012-05-25 DIAGNOSIS — Z9181 History of falling: Secondary | ICD-10-CM | POA: Diagnosis not present

## 2012-05-25 DIAGNOSIS — I10 Essential (primary) hypertension: Secondary | ICD-10-CM | POA: Diagnosis not present

## 2012-05-26 DIAGNOSIS — E871 Hypo-osmolality and hyponatremia: Secondary | ICD-10-CM | POA: Diagnosis not present

## 2012-05-31 DIAGNOSIS — S329XXA Fracture of unspecified parts of lumbosacral spine and pelvis, initial encounter for closed fracture: Secondary | ICD-10-CM | POA: Diagnosis not present

## 2012-06-03 DIAGNOSIS — Z79899 Other long term (current) drug therapy: Secondary | ICD-10-CM | POA: Diagnosis not present

## 2012-06-03 DIAGNOSIS — M2550 Pain in unspecified joint: Secondary | ICD-10-CM | POA: Diagnosis not present

## 2012-06-03 DIAGNOSIS — M069 Rheumatoid arthritis, unspecified: Secondary | ICD-10-CM | POA: Diagnosis not present

## 2012-07-23 DIAGNOSIS — N390 Urinary tract infection, site not specified: Secondary | ICD-10-CM | POA: Diagnosis not present

## 2012-08-07 DIAGNOSIS — Z23 Encounter for immunization: Secondary | ICD-10-CM | POA: Diagnosis not present

## 2012-08-19 DIAGNOSIS — D0000 Carcinoma in situ of oral cavity, unspecified site: Secondary | ICD-10-CM | POA: Diagnosis not present

## 2012-08-19 DIAGNOSIS — D0008 Carcinoma in situ of pharynx: Secondary | ICD-10-CM | POA: Diagnosis not present

## 2012-08-25 DIAGNOSIS — Z85828 Personal history of other malignant neoplasm of skin: Secondary | ICD-10-CM | POA: Diagnosis not present

## 2012-08-25 DIAGNOSIS — D485 Neoplasm of uncertain behavior of skin: Secondary | ICD-10-CM | POA: Diagnosis not present

## 2012-08-25 DIAGNOSIS — L570 Actinic keratosis: Secondary | ICD-10-CM | POA: Diagnosis not present

## 2012-09-06 DIAGNOSIS — M2550 Pain in unspecified joint: Secondary | ICD-10-CM | POA: Diagnosis not present

## 2012-09-06 DIAGNOSIS — M069 Rheumatoid arthritis, unspecified: Secondary | ICD-10-CM | POA: Diagnosis not present

## 2012-09-06 DIAGNOSIS — M810 Age-related osteoporosis without current pathological fracture: Secondary | ICD-10-CM | POA: Diagnosis not present

## 2012-09-06 DIAGNOSIS — Z79899 Other long term (current) drug therapy: Secondary | ICD-10-CM | POA: Diagnosis not present

## 2012-10-15 DIAGNOSIS — R5381 Other malaise: Secondary | ICD-10-CM | POA: Diagnosis not present

## 2012-11-04 DIAGNOSIS — Z1331 Encounter for screening for depression: Secondary | ICD-10-CM | POA: Diagnosis not present

## 2012-11-04 DIAGNOSIS — Z0000 Encounter for general adult medical examination without abnormal findings: Secondary | ICD-10-CM | POA: Diagnosis not present

## 2012-11-25 DIAGNOSIS — M25579 Pain in unspecified ankle and joints of unspecified foot: Secondary | ICD-10-CM | POA: Diagnosis not present

## 2012-12-01 DIAGNOSIS — L03039 Cellulitis of unspecified toe: Secondary | ICD-10-CM | POA: Diagnosis not present

## 2012-12-16 DIAGNOSIS — B370 Candidal stomatitis: Secondary | ICD-10-CM | POA: Diagnosis not present

## 2012-12-30 DIAGNOSIS — L259 Unspecified contact dermatitis, unspecified cause: Secondary | ICD-10-CM | POA: Diagnosis not present

## 2013-01-18 DIAGNOSIS — N3020 Other chronic cystitis without hematuria: Secondary | ICD-10-CM | POA: Diagnosis not present

## 2013-01-27 DIAGNOSIS — M2550 Pain in unspecified joint: Secondary | ICD-10-CM | POA: Diagnosis not present

## 2013-01-27 DIAGNOSIS — Z79899 Other long term (current) drug therapy: Secondary | ICD-10-CM | POA: Diagnosis not present

## 2013-01-27 DIAGNOSIS — M069 Rheumatoid arthritis, unspecified: Secondary | ICD-10-CM | POA: Diagnosis not present

## 2013-02-01 DIAGNOSIS — D0000 Carcinoma in situ of oral cavity, unspecified site: Secondary | ICD-10-CM | POA: Diagnosis not present

## 2013-02-01 DIAGNOSIS — C021 Malignant neoplasm of border of tongue: Secondary | ICD-10-CM | POA: Diagnosis not present

## 2013-02-08 DIAGNOSIS — D0008 Carcinoma in situ of pharynx: Secondary | ICD-10-CM | POA: Diagnosis not present

## 2013-02-08 DIAGNOSIS — D0000 Carcinoma in situ of oral cavity, unspecified site: Secondary | ICD-10-CM | POA: Diagnosis not present

## 2013-02-09 DIAGNOSIS — D101 Benign neoplasm of tongue: Secondary | ICD-10-CM | POA: Diagnosis not present

## 2013-02-09 DIAGNOSIS — M542 Cervicalgia: Secondary | ICD-10-CM | POA: Diagnosis not present

## 2013-02-11 ENCOUNTER — Ambulatory Visit (HOSPITAL_COMMUNITY): Admission: RE | Admit: 2013-02-11 | Payer: Medicare Other | Source: Ambulatory Visit | Admitting: Otolaryngology

## 2013-02-11 ENCOUNTER — Encounter (HOSPITAL_COMMUNITY): Admission: RE | Payer: Self-pay | Source: Ambulatory Visit

## 2013-02-11 SURGERY — HEMIGLOSSECTOMY
Anesthesia: General | Laterality: Right

## 2013-02-21 DIAGNOSIS — M069 Rheumatoid arthritis, unspecified: Secondary | ICD-10-CM | POA: Diagnosis not present

## 2013-02-21 DIAGNOSIS — C029 Malignant neoplasm of tongue, unspecified: Secondary | ICD-10-CM | POA: Diagnosis not present

## 2013-02-21 DIAGNOSIS — M948X9 Other specified disorders of cartilage, unspecified sites: Secondary | ICD-10-CM | POA: Diagnosis not present

## 2013-02-21 DIAGNOSIS — M19019 Primary osteoarthritis, unspecified shoulder: Secondary | ICD-10-CM | POA: Diagnosis not present

## 2013-03-16 DIAGNOSIS — R9431 Abnormal electrocardiogram [ECG] [EKG]: Secondary | ICD-10-CM | POA: Diagnosis not present

## 2013-03-16 DIAGNOSIS — Z01810 Encounter for preprocedural cardiovascular examination: Secondary | ICD-10-CM | POA: Diagnosis not present

## 2013-03-22 DIAGNOSIS — Z9849 Cataract extraction status, unspecified eye: Secondary | ICD-10-CM | POA: Diagnosis not present

## 2013-03-22 DIAGNOSIS — R05 Cough: Secondary | ICD-10-CM | POA: Diagnosis not present

## 2013-03-22 DIAGNOSIS — M069 Rheumatoid arthritis, unspecified: Secondary | ICD-10-CM | POA: Diagnosis present

## 2013-03-22 DIAGNOSIS — I10 Essential (primary) hypertension: Secondary | ICD-10-CM | POA: Diagnosis present

## 2013-03-22 DIAGNOSIS — Z809 Family history of malignant neoplasm, unspecified: Secondary | ICD-10-CM | POA: Diagnosis not present

## 2013-03-22 DIAGNOSIS — D62 Acute posthemorrhagic anemia: Secondary | ICD-10-CM | POA: Diagnosis not present

## 2013-03-22 DIAGNOSIS — M810 Age-related osteoporosis without current pathological fracture: Secondary | ICD-10-CM | POA: Diagnosis present

## 2013-03-22 DIAGNOSIS — R569 Unspecified convulsions: Secondary | ICD-10-CM | POA: Diagnosis not present

## 2013-03-22 DIAGNOSIS — Z90710 Acquired absence of both cervix and uterus: Secondary | ICD-10-CM | POA: Diagnosis not present

## 2013-03-22 DIAGNOSIS — K589 Irritable bowel syndrome without diarrhea: Secondary | ICD-10-CM | POA: Diagnosis present

## 2013-03-22 DIAGNOSIS — Z96649 Presence of unspecified artificial hip joint: Secondary | ICD-10-CM | POA: Diagnosis not present

## 2013-03-22 DIAGNOSIS — C069 Malignant neoplasm of mouth, unspecified: Secondary | ICD-10-CM | POA: Diagnosis not present

## 2013-03-22 DIAGNOSIS — Z87891 Personal history of nicotine dependence: Secondary | ICD-10-CM | POA: Diagnosis not present

## 2013-03-22 DIAGNOSIS — C029 Malignant neoplasm of tongue, unspecified: Secondary | ICD-10-CM | POA: Diagnosis not present

## 2013-03-22 DIAGNOSIS — Z4682 Encounter for fitting and adjustment of non-vascular catheter: Secondary | ICD-10-CM | POA: Diagnosis not present

## 2013-03-22 DIAGNOSIS — Z9889 Other specified postprocedural states: Secondary | ICD-10-CM | POA: Diagnosis not present

## 2013-03-22 DIAGNOSIS — Z9089 Acquired absence of other organs: Secondary | ICD-10-CM | POA: Diagnosis not present

## 2013-03-22 DIAGNOSIS — Z792 Long term (current) use of antibiotics: Secondary | ICD-10-CM | POA: Diagnosis not present

## 2013-03-22 DIAGNOSIS — K1370 Unspecified lesions of oral mucosa: Secondary | ICD-10-CM | POA: Diagnosis not present

## 2013-03-28 DIAGNOSIS — C069 Malignant neoplasm of mouth, unspecified: Secondary | ICD-10-CM | POA: Diagnosis not present

## 2013-04-27 DIAGNOSIS — C029 Malignant neoplasm of tongue, unspecified: Secondary | ICD-10-CM | POA: Diagnosis not present

## 2013-04-28 DIAGNOSIS — Z79899 Other long term (current) drug therapy: Secondary | ICD-10-CM | POA: Diagnosis not present

## 2013-04-28 DIAGNOSIS — M069 Rheumatoid arthritis, unspecified: Secondary | ICD-10-CM | POA: Diagnosis not present

## 2013-04-28 DIAGNOSIS — M2550 Pain in unspecified joint: Secondary | ICD-10-CM | POA: Diagnosis not present

## 2013-05-05 DIAGNOSIS — I10 Essential (primary) hypertension: Secondary | ICD-10-CM | POA: Diagnosis not present

## 2013-06-14 DIAGNOSIS — N3020 Other chronic cystitis without hematuria: Secondary | ICD-10-CM | POA: Diagnosis not present

## 2013-07-27 DIAGNOSIS — C029 Malignant neoplasm of tongue, unspecified: Secondary | ICD-10-CM | POA: Diagnosis not present

## 2013-07-28 DIAGNOSIS — M069 Rheumatoid arthritis, unspecified: Secondary | ICD-10-CM | POA: Diagnosis not present

## 2013-07-28 DIAGNOSIS — M2550 Pain in unspecified joint: Secondary | ICD-10-CM | POA: Diagnosis not present

## 2013-07-28 DIAGNOSIS — Z79899 Other long term (current) drug therapy: Secondary | ICD-10-CM | POA: Diagnosis not present

## 2013-08-11 DIAGNOSIS — H04129 Dry eye syndrome of unspecified lacrimal gland: Secondary | ICD-10-CM | POA: Diagnosis not present

## 2013-08-11 DIAGNOSIS — H524 Presbyopia: Secondary | ICD-10-CM | POA: Diagnosis not present

## 2013-08-11 DIAGNOSIS — Z961 Presence of intraocular lens: Secondary | ICD-10-CM | POA: Diagnosis not present

## 2013-08-11 DIAGNOSIS — H52209 Unspecified astigmatism, unspecified eye: Secondary | ICD-10-CM | POA: Diagnosis not present

## 2013-09-01 DIAGNOSIS — Z79899 Other long term (current) drug therapy: Secondary | ICD-10-CM | POA: Diagnosis not present

## 2013-09-01 DIAGNOSIS — I10 Essential (primary) hypertension: Secondary | ICD-10-CM | POA: Diagnosis not present

## 2013-09-01 DIAGNOSIS — M810 Age-related osteoporosis without current pathological fracture: Secondary | ICD-10-CM | POA: Diagnosis not present

## 2013-10-18 DIAGNOSIS — I10 Essential (primary) hypertension: Secondary | ICD-10-CM | POA: Diagnosis not present

## 2013-10-18 DIAGNOSIS — Z79899 Other long term (current) drug therapy: Secondary | ICD-10-CM | POA: Diagnosis not present

## 2013-10-26 DIAGNOSIS — C029 Malignant neoplasm of tongue, unspecified: Secondary | ICD-10-CM | POA: Diagnosis not present

## 2013-10-27 DIAGNOSIS — M2550 Pain in unspecified joint: Secondary | ICD-10-CM | POA: Diagnosis not present

## 2013-10-27 DIAGNOSIS — M069 Rheumatoid arthritis, unspecified: Secondary | ICD-10-CM | POA: Diagnosis not present

## 2013-10-27 DIAGNOSIS — Z79899 Other long term (current) drug therapy: Secondary | ICD-10-CM | POA: Diagnosis not present

## 2013-11-22 DIAGNOSIS — L259 Unspecified contact dermatitis, unspecified cause: Secondary | ICD-10-CM | POA: Diagnosis not present

## 2013-12-13 DIAGNOSIS — L219 Seborrheic dermatitis, unspecified: Secondary | ICD-10-CM | POA: Diagnosis not present

## 2014-02-01 DIAGNOSIS — C029 Malignant neoplasm of tongue, unspecified: Secondary | ICD-10-CM | POA: Diagnosis not present

## 2014-02-02 DIAGNOSIS — M2550 Pain in unspecified joint: Secondary | ICD-10-CM | POA: Diagnosis not present

## 2014-02-02 DIAGNOSIS — M069 Rheumatoid arthritis, unspecified: Secondary | ICD-10-CM | POA: Diagnosis not present

## 2014-02-02 DIAGNOSIS — Z79899 Other long term (current) drug therapy: Secondary | ICD-10-CM | POA: Diagnosis not present

## 2014-02-21 DIAGNOSIS — N3020 Other chronic cystitis without hematuria: Secondary | ICD-10-CM | POA: Diagnosis not present

## 2014-03-01 DIAGNOSIS — Z0000 Encounter for general adult medical examination without abnormal findings: Secondary | ICD-10-CM | POA: Diagnosis not present

## 2014-03-01 DIAGNOSIS — Z1331 Encounter for screening for depression: Secondary | ICD-10-CM | POA: Diagnosis not present

## 2014-03-01 DIAGNOSIS — I10 Essential (primary) hypertension: Secondary | ICD-10-CM | POA: Diagnosis not present

## 2014-03-06 DIAGNOSIS — Z79899 Other long term (current) drug therapy: Secondary | ICD-10-CM | POA: Diagnosis not present

## 2014-03-06 DIAGNOSIS — M069 Rheumatoid arthritis, unspecified: Secondary | ICD-10-CM | POA: Diagnosis not present

## 2014-03-06 DIAGNOSIS — M2550 Pain in unspecified joint: Secondary | ICD-10-CM | POA: Diagnosis not present

## 2014-03-23 DIAGNOSIS — L259 Unspecified contact dermatitis, unspecified cause: Secondary | ICD-10-CM | POA: Diagnosis not present

## 2014-03-27 DIAGNOSIS — Z79899 Other long term (current) drug therapy: Secondary | ICD-10-CM | POA: Diagnosis not present

## 2014-04-06 DIAGNOSIS — M069 Rheumatoid arthritis, unspecified: Secondary | ICD-10-CM | POA: Diagnosis not present

## 2014-04-06 DIAGNOSIS — Z79899 Other long term (current) drug therapy: Secondary | ICD-10-CM | POA: Diagnosis not present

## 2014-04-06 DIAGNOSIS — M7500 Adhesive capsulitis of unspecified shoulder: Secondary | ICD-10-CM | POA: Diagnosis not present

## 2014-04-06 DIAGNOSIS — M2550 Pain in unspecified joint: Secondary | ICD-10-CM | POA: Diagnosis not present

## 2014-04-12 DIAGNOSIS — M069 Rheumatoid arthritis, unspecified: Secondary | ICD-10-CM | POA: Diagnosis not present

## 2014-04-12 DIAGNOSIS — Z9181 History of falling: Secondary | ICD-10-CM | POA: Diagnosis not present

## 2014-04-12 DIAGNOSIS — M7500 Adhesive capsulitis of unspecified shoulder: Secondary | ICD-10-CM | POA: Diagnosis not present

## 2014-04-12 DIAGNOSIS — I10 Essential (primary) hypertension: Secondary | ICD-10-CM | POA: Diagnosis not present

## 2014-04-12 DIAGNOSIS — IMO0001 Reserved for inherently not codable concepts without codable children: Secondary | ICD-10-CM | POA: Diagnosis not present

## 2014-04-12 DIAGNOSIS — M810 Age-related osteoporosis without current pathological fracture: Secondary | ICD-10-CM | POA: Diagnosis not present

## 2014-04-18 DIAGNOSIS — M7500 Adhesive capsulitis of unspecified shoulder: Secondary | ICD-10-CM | POA: Diagnosis not present

## 2014-04-18 DIAGNOSIS — M069 Rheumatoid arthritis, unspecified: Secondary | ICD-10-CM | POA: Diagnosis not present

## 2014-04-18 DIAGNOSIS — IMO0001 Reserved for inherently not codable concepts without codable children: Secondary | ICD-10-CM | POA: Diagnosis not present

## 2014-04-18 DIAGNOSIS — M810 Age-related osteoporosis without current pathological fracture: Secondary | ICD-10-CM | POA: Diagnosis not present

## 2014-04-18 DIAGNOSIS — Z9181 History of falling: Secondary | ICD-10-CM | POA: Diagnosis not present

## 2014-04-18 DIAGNOSIS — I10 Essential (primary) hypertension: Secondary | ICD-10-CM | POA: Diagnosis not present

## 2014-04-21 DIAGNOSIS — Z9181 History of falling: Secondary | ICD-10-CM | POA: Diagnosis not present

## 2014-04-21 DIAGNOSIS — M7500 Adhesive capsulitis of unspecified shoulder: Secondary | ICD-10-CM | POA: Diagnosis not present

## 2014-04-21 DIAGNOSIS — IMO0001 Reserved for inherently not codable concepts without codable children: Secondary | ICD-10-CM | POA: Diagnosis not present

## 2014-04-21 DIAGNOSIS — M810 Age-related osteoporosis without current pathological fracture: Secondary | ICD-10-CM | POA: Diagnosis not present

## 2014-04-21 DIAGNOSIS — M069 Rheumatoid arthritis, unspecified: Secondary | ICD-10-CM | POA: Diagnosis not present

## 2014-04-21 DIAGNOSIS — I10 Essential (primary) hypertension: Secondary | ICD-10-CM | POA: Diagnosis not present

## 2014-04-25 DIAGNOSIS — M069 Rheumatoid arthritis, unspecified: Secondary | ICD-10-CM | POA: Diagnosis not present

## 2014-04-25 DIAGNOSIS — M810 Age-related osteoporosis without current pathological fracture: Secondary | ICD-10-CM | POA: Diagnosis not present

## 2014-04-25 DIAGNOSIS — I10 Essential (primary) hypertension: Secondary | ICD-10-CM | POA: Diagnosis not present

## 2014-04-25 DIAGNOSIS — M7500 Adhesive capsulitis of unspecified shoulder: Secondary | ICD-10-CM | POA: Diagnosis not present

## 2014-04-25 DIAGNOSIS — Z9181 History of falling: Secondary | ICD-10-CM | POA: Diagnosis not present

## 2014-04-25 DIAGNOSIS — IMO0001 Reserved for inherently not codable concepts without codable children: Secondary | ICD-10-CM | POA: Diagnosis not present

## 2014-04-27 DIAGNOSIS — M7500 Adhesive capsulitis of unspecified shoulder: Secondary | ICD-10-CM | POA: Diagnosis not present

## 2014-04-27 DIAGNOSIS — IMO0001 Reserved for inherently not codable concepts without codable children: Secondary | ICD-10-CM | POA: Diagnosis not present

## 2014-04-27 DIAGNOSIS — M810 Age-related osteoporosis without current pathological fracture: Secondary | ICD-10-CM | POA: Diagnosis not present

## 2014-04-27 DIAGNOSIS — M069 Rheumatoid arthritis, unspecified: Secondary | ICD-10-CM | POA: Diagnosis not present

## 2014-04-27 DIAGNOSIS — Z9181 History of falling: Secondary | ICD-10-CM | POA: Diagnosis not present

## 2014-04-27 DIAGNOSIS — I10 Essential (primary) hypertension: Secondary | ICD-10-CM | POA: Diagnosis not present

## 2014-05-02 DIAGNOSIS — M810 Age-related osteoporosis without current pathological fracture: Secondary | ICD-10-CM | POA: Diagnosis not present

## 2014-05-02 DIAGNOSIS — M069 Rheumatoid arthritis, unspecified: Secondary | ICD-10-CM | POA: Diagnosis not present

## 2014-05-02 DIAGNOSIS — M7500 Adhesive capsulitis of unspecified shoulder: Secondary | ICD-10-CM | POA: Diagnosis not present

## 2014-05-02 DIAGNOSIS — IMO0001 Reserved for inherently not codable concepts without codable children: Secondary | ICD-10-CM | POA: Diagnosis not present

## 2014-05-02 DIAGNOSIS — Z9181 History of falling: Secondary | ICD-10-CM | POA: Diagnosis not present

## 2014-05-02 DIAGNOSIS — I10 Essential (primary) hypertension: Secondary | ICD-10-CM | POA: Diagnosis not present

## 2014-05-04 DIAGNOSIS — M069 Rheumatoid arthritis, unspecified: Secondary | ICD-10-CM | POA: Diagnosis not present

## 2014-05-04 DIAGNOSIS — M7500 Adhesive capsulitis of unspecified shoulder: Secondary | ICD-10-CM | POA: Diagnosis not present

## 2014-05-04 DIAGNOSIS — Z9181 History of falling: Secondary | ICD-10-CM | POA: Diagnosis not present

## 2014-05-04 DIAGNOSIS — I10 Essential (primary) hypertension: Secondary | ICD-10-CM | POA: Diagnosis not present

## 2014-05-04 DIAGNOSIS — M810 Age-related osteoporosis without current pathological fracture: Secondary | ICD-10-CM | POA: Diagnosis not present

## 2014-05-04 DIAGNOSIS — IMO0001 Reserved for inherently not codable concepts without codable children: Secondary | ICD-10-CM | POA: Diagnosis not present

## 2014-05-09 DIAGNOSIS — I10 Essential (primary) hypertension: Secondary | ICD-10-CM | POA: Diagnosis not present

## 2014-05-09 DIAGNOSIS — Z9181 History of falling: Secondary | ICD-10-CM | POA: Diagnosis not present

## 2014-05-09 DIAGNOSIS — M810 Age-related osteoporosis without current pathological fracture: Secondary | ICD-10-CM | POA: Diagnosis not present

## 2014-05-09 DIAGNOSIS — M7500 Adhesive capsulitis of unspecified shoulder: Secondary | ICD-10-CM | POA: Diagnosis not present

## 2014-05-09 DIAGNOSIS — M069 Rheumatoid arthritis, unspecified: Secondary | ICD-10-CM | POA: Diagnosis not present

## 2014-05-09 DIAGNOSIS — IMO0001 Reserved for inherently not codable concepts without codable children: Secondary | ICD-10-CM | POA: Diagnosis not present

## 2014-05-11 DIAGNOSIS — Z9181 History of falling: Secondary | ICD-10-CM | POA: Diagnosis not present

## 2014-05-11 DIAGNOSIS — IMO0001 Reserved for inherently not codable concepts without codable children: Secondary | ICD-10-CM | POA: Diagnosis not present

## 2014-05-11 DIAGNOSIS — I10 Essential (primary) hypertension: Secondary | ICD-10-CM | POA: Diagnosis not present

## 2014-05-11 DIAGNOSIS — M7500 Adhesive capsulitis of unspecified shoulder: Secondary | ICD-10-CM | POA: Diagnosis not present

## 2014-05-11 DIAGNOSIS — M810 Age-related osteoporosis without current pathological fracture: Secondary | ICD-10-CM | POA: Diagnosis not present

## 2014-05-11 DIAGNOSIS — M069 Rheumatoid arthritis, unspecified: Secondary | ICD-10-CM | POA: Diagnosis not present

## 2014-05-15 DIAGNOSIS — M25559 Pain in unspecified hip: Secondary | ICD-10-CM | POA: Diagnosis not present

## 2014-05-24 DIAGNOSIS — C029 Malignant neoplasm of tongue, unspecified: Secondary | ICD-10-CM | POA: Diagnosis not present

## 2014-06-13 DIAGNOSIS — M069 Rheumatoid arthritis, unspecified: Secondary | ICD-10-CM | POA: Diagnosis not present

## 2014-06-13 DIAGNOSIS — M2550 Pain in unspecified joint: Secondary | ICD-10-CM | POA: Diagnosis not present

## 2014-06-13 DIAGNOSIS — Z79899 Other long term (current) drug therapy: Secondary | ICD-10-CM | POA: Diagnosis not present

## 2014-06-28 DIAGNOSIS — Z79899 Other long term (current) drug therapy: Secondary | ICD-10-CM | POA: Diagnosis not present

## 2014-06-28 DIAGNOSIS — I10 Essential (primary) hypertension: Secondary | ICD-10-CM | POA: Diagnosis not present

## 2014-07-31 DIAGNOSIS — M25569 Pain in unspecified knee: Secondary | ICD-10-CM | POA: Diagnosis not present

## 2014-08-02 DIAGNOSIS — M179 Osteoarthritis of knee, unspecified: Secondary | ICD-10-CM | POA: Diagnosis not present

## 2014-08-02 DIAGNOSIS — I10 Essential (primary) hypertension: Secondary | ICD-10-CM | POA: Diagnosis not present

## 2014-08-02 DIAGNOSIS — R262 Difficulty in walking, not elsewhere classified: Secondary | ICD-10-CM | POA: Diagnosis not present

## 2014-08-02 DIAGNOSIS — Z9181 History of falling: Secondary | ICD-10-CM | POA: Diagnosis not present

## 2014-08-02 DIAGNOSIS — M069 Rheumatoid arthritis, unspecified: Secondary | ICD-10-CM | POA: Diagnosis not present

## 2014-08-04 DIAGNOSIS — M069 Rheumatoid arthritis, unspecified: Secondary | ICD-10-CM | POA: Diagnosis not present

## 2014-08-04 DIAGNOSIS — R262 Difficulty in walking, not elsewhere classified: Secondary | ICD-10-CM | POA: Diagnosis not present

## 2014-08-04 DIAGNOSIS — Z9181 History of falling: Secondary | ICD-10-CM | POA: Diagnosis not present

## 2014-08-04 DIAGNOSIS — M179 Osteoarthritis of knee, unspecified: Secondary | ICD-10-CM | POA: Diagnosis not present

## 2014-08-04 DIAGNOSIS — I10 Essential (primary) hypertension: Secondary | ICD-10-CM | POA: Diagnosis not present

## 2014-08-08 DIAGNOSIS — M179 Osteoarthritis of knee, unspecified: Secondary | ICD-10-CM | POA: Diagnosis not present

## 2014-08-08 DIAGNOSIS — I10 Essential (primary) hypertension: Secondary | ICD-10-CM | POA: Diagnosis not present

## 2014-08-08 DIAGNOSIS — R262 Difficulty in walking, not elsewhere classified: Secondary | ICD-10-CM | POA: Diagnosis not present

## 2014-08-08 DIAGNOSIS — Z9181 History of falling: Secondary | ICD-10-CM | POA: Diagnosis not present

## 2014-08-08 DIAGNOSIS — M069 Rheumatoid arthritis, unspecified: Secondary | ICD-10-CM | POA: Diagnosis not present

## 2014-08-10 DIAGNOSIS — M069 Rheumatoid arthritis, unspecified: Secondary | ICD-10-CM | POA: Diagnosis not present

## 2014-08-10 DIAGNOSIS — I10 Essential (primary) hypertension: Secondary | ICD-10-CM | POA: Diagnosis not present

## 2014-08-10 DIAGNOSIS — M179 Osteoarthritis of knee, unspecified: Secondary | ICD-10-CM | POA: Diagnosis not present

## 2014-08-10 DIAGNOSIS — Z9181 History of falling: Secondary | ICD-10-CM | POA: Diagnosis not present

## 2014-08-10 DIAGNOSIS — R262 Difficulty in walking, not elsewhere classified: Secondary | ICD-10-CM | POA: Diagnosis not present

## 2014-08-14 DIAGNOSIS — I10 Essential (primary) hypertension: Secondary | ICD-10-CM | POA: Diagnosis not present

## 2014-08-14 DIAGNOSIS — R262 Difficulty in walking, not elsewhere classified: Secondary | ICD-10-CM | POA: Diagnosis not present

## 2014-08-14 DIAGNOSIS — M179 Osteoarthritis of knee, unspecified: Secondary | ICD-10-CM | POA: Diagnosis not present

## 2014-08-14 DIAGNOSIS — Z9181 History of falling: Secondary | ICD-10-CM | POA: Diagnosis not present

## 2014-08-14 DIAGNOSIS — M069 Rheumatoid arthritis, unspecified: Secondary | ICD-10-CM | POA: Diagnosis not present

## 2014-08-16 DIAGNOSIS — Z9181 History of falling: Secondary | ICD-10-CM | POA: Diagnosis not present

## 2014-08-16 DIAGNOSIS — M069 Rheumatoid arthritis, unspecified: Secondary | ICD-10-CM | POA: Diagnosis not present

## 2014-08-16 DIAGNOSIS — I10 Essential (primary) hypertension: Secondary | ICD-10-CM | POA: Diagnosis not present

## 2014-08-16 DIAGNOSIS — R262 Difficulty in walking, not elsewhere classified: Secondary | ICD-10-CM | POA: Diagnosis not present

## 2014-08-16 DIAGNOSIS — M179 Osteoarthritis of knee, unspecified: Secondary | ICD-10-CM | POA: Diagnosis not present

## 2014-08-17 DIAGNOSIS — M179 Osteoarthritis of knee, unspecified: Secondary | ICD-10-CM | POA: Diagnosis not present

## 2014-08-17 DIAGNOSIS — Z9181 History of falling: Secondary | ICD-10-CM | POA: Diagnosis not present

## 2014-08-17 DIAGNOSIS — M069 Rheumatoid arthritis, unspecified: Secondary | ICD-10-CM | POA: Diagnosis not present

## 2014-08-17 DIAGNOSIS — R262 Difficulty in walking, not elsewhere classified: Secondary | ICD-10-CM | POA: Diagnosis not present

## 2014-08-17 DIAGNOSIS — I10 Essential (primary) hypertension: Secondary | ICD-10-CM | POA: Diagnosis not present

## 2014-08-23 DIAGNOSIS — M069 Rheumatoid arthritis, unspecified: Secondary | ICD-10-CM | POA: Diagnosis not present

## 2014-08-23 DIAGNOSIS — Z9181 History of falling: Secondary | ICD-10-CM | POA: Diagnosis not present

## 2014-08-23 DIAGNOSIS — M179 Osteoarthritis of knee, unspecified: Secondary | ICD-10-CM | POA: Diagnosis not present

## 2014-08-23 DIAGNOSIS — R262 Difficulty in walking, not elsewhere classified: Secondary | ICD-10-CM | POA: Diagnosis not present

## 2014-08-23 DIAGNOSIS — I10 Essential (primary) hypertension: Secondary | ICD-10-CM | POA: Diagnosis not present

## 2014-08-25 DIAGNOSIS — R262 Difficulty in walking, not elsewhere classified: Secondary | ICD-10-CM | POA: Diagnosis not present

## 2014-08-25 DIAGNOSIS — M069 Rheumatoid arthritis, unspecified: Secondary | ICD-10-CM | POA: Diagnosis not present

## 2014-08-25 DIAGNOSIS — M179 Osteoarthritis of knee, unspecified: Secondary | ICD-10-CM | POA: Diagnosis not present

## 2014-08-25 DIAGNOSIS — Z9181 History of falling: Secondary | ICD-10-CM | POA: Diagnosis not present

## 2014-08-25 DIAGNOSIS — I10 Essential (primary) hypertension: Secondary | ICD-10-CM | POA: Diagnosis not present

## 2014-08-30 DIAGNOSIS — M0689 Other specified rheumatoid arthritis, multiple sites: Secondary | ICD-10-CM | POA: Diagnosis not present

## 2014-08-30 DIAGNOSIS — I10 Essential (primary) hypertension: Secondary | ICD-10-CM | POA: Diagnosis not present

## 2014-09-21 DIAGNOSIS — Z79899 Other long term (current) drug therapy: Secondary | ICD-10-CM | POA: Diagnosis not present

## 2014-09-21 DIAGNOSIS — M2550 Pain in unspecified joint: Secondary | ICD-10-CM | POA: Diagnosis not present

## 2014-09-21 DIAGNOSIS — M0579 Rheumatoid arthritis with rheumatoid factor of multiple sites without organ or systems involvement: Secondary | ICD-10-CM | POA: Diagnosis not present

## 2014-09-27 DIAGNOSIS — C029 Malignant neoplasm of tongue, unspecified: Secondary | ICD-10-CM | POA: Diagnosis not present

## 2014-09-27 DIAGNOSIS — Z85810 Personal history of malignant neoplasm of tongue: Secondary | ICD-10-CM | POA: Diagnosis not present

## 2014-11-16 DIAGNOSIS — H04123 Dry eye syndrome of bilateral lacrimal glands: Secondary | ICD-10-CM | POA: Diagnosis not present

## 2014-11-16 DIAGNOSIS — Z961 Presence of intraocular lens: Secondary | ICD-10-CM | POA: Diagnosis not present

## 2014-11-23 DIAGNOSIS — M25551 Pain in right hip: Secondary | ICD-10-CM | POA: Diagnosis not present

## 2014-11-23 DIAGNOSIS — M25552 Pain in left hip: Secondary | ICD-10-CM | POA: Diagnosis not present

## 2014-12-28 ENCOUNTER — Emergency Department (HOSPITAL_COMMUNITY)
Admission: EM | Admit: 2014-12-28 | Discharge: 2014-12-28 | Disposition: A | Discharge status: Home / Self Care | Payer: Medicare Other | Attending: Emergency Medicine | Admitting: Emergency Medicine

## 2014-12-28 ENCOUNTER — Encounter (HOSPITAL_COMMUNITY): Payer: Self-pay | Admitting: *Deleted

## 2014-12-28 ENCOUNTER — Emergency Department (HOSPITAL_COMMUNITY): Payer: Medicare Other

## 2014-12-28 DIAGNOSIS — Z85828 Personal history of other malignant neoplasm of skin: Secondary | ICD-10-CM | POA: Insufficient documentation

## 2014-12-28 DIAGNOSIS — W01198A Fall on same level from slipping, tripping and stumbling with subsequent striking against other object, initial encounter: Secondary | ICD-10-CM | POA: Insufficient documentation

## 2014-12-28 DIAGNOSIS — S92302A Fracture of unspecified metatarsal bone(s), left foot, initial encounter for closed fracture: Secondary | ICD-10-CM

## 2014-12-28 DIAGNOSIS — Z79899 Other long term (current) drug therapy: Secondary | ICD-10-CM | POA: Insufficient documentation

## 2014-12-28 DIAGNOSIS — Z8742 Personal history of other diseases of the female genital tract: Secondary | ICD-10-CM | POA: Insufficient documentation

## 2014-12-28 DIAGNOSIS — Z87828 Personal history of other (healed) physical injury and trauma: Secondary | ICD-10-CM | POA: Diagnosis not present

## 2014-12-28 DIAGNOSIS — S299XXA Unspecified injury of thorax, initial encounter: Secondary | ICD-10-CM | POA: Diagnosis not present

## 2014-12-28 DIAGNOSIS — Z7982 Long term (current) use of aspirin: Secondary | ICD-10-CM | POA: Diagnosis not present

## 2014-12-28 DIAGNOSIS — Y9289 Other specified places as the place of occurrence of the external cause: Secondary | ICD-10-CM | POA: Diagnosis not present

## 2014-12-28 DIAGNOSIS — S92352A Displaced fracture of fifth metatarsal bone, left foot, initial encounter for closed fracture: Secondary | ICD-10-CM | POA: Diagnosis not present

## 2014-12-28 DIAGNOSIS — I10 Essential (primary) hypertension: Secondary | ICD-10-CM | POA: Diagnosis not present

## 2014-12-28 DIAGNOSIS — Z8719 Personal history of other diseases of the digestive system: Secondary | ICD-10-CM | POA: Diagnosis not present

## 2014-12-28 DIAGNOSIS — Z8709 Personal history of other diseases of the respiratory system: Secondary | ICD-10-CM | POA: Insufficient documentation

## 2014-12-28 DIAGNOSIS — S20212A Contusion of left front wall of thorax, initial encounter: Secondary | ICD-10-CM | POA: Diagnosis not present

## 2014-12-28 DIAGNOSIS — Z9889 Other specified postprocedural states: Secondary | ICD-10-CM | POA: Insufficient documentation

## 2014-12-28 DIAGNOSIS — Z87891 Personal history of nicotine dependence: Secondary | ICD-10-CM | POA: Diagnosis not present

## 2014-12-28 DIAGNOSIS — S92355A Nondisplaced fracture of fifth metatarsal bone, left foot, initial encounter for closed fracture: Secondary | ICD-10-CM | POA: Insufficient documentation

## 2014-12-28 DIAGNOSIS — Y998 Other external cause status: Secondary | ICD-10-CM | POA: Diagnosis not present

## 2014-12-28 DIAGNOSIS — M069 Rheumatoid arthritis, unspecified: Secondary | ICD-10-CM | POA: Insufficient documentation

## 2014-12-28 DIAGNOSIS — R0781 Pleurodynia: Secondary | ICD-10-CM | POA: Diagnosis not present

## 2014-12-28 DIAGNOSIS — Y9389 Activity, other specified: Secondary | ICD-10-CM | POA: Insufficient documentation

## 2014-12-28 DIAGNOSIS — M810 Age-related osteoporosis without current pathological fracture: Secondary | ICD-10-CM | POA: Diagnosis not present

## 2014-12-28 DIAGNOSIS — S99922A Unspecified injury of left foot, initial encounter: Secondary | ICD-10-CM | POA: Diagnosis present

## 2014-12-28 DIAGNOSIS — G40909 Epilepsy, unspecified, not intractable, without status epilepticus: Secondary | ICD-10-CM | POA: Diagnosis not present

## 2014-12-28 HISTORY — DX: Interstitial cystitis (chronic) without hematuria: N30.10

## 2014-12-28 NOTE — ED Notes (Signed)
Awaiting pt to come from x-ray

## 2014-12-28 NOTE — ED Provider Notes (Signed)
CSN: [id number]     Arrival date & time 12/28/14  1527 History   First MD Initiated Contact with Patient 12/28/14 1747     Chief Complaint  Patient presents with  . Fall     (Consider location/radiation/quality/duration/timing/severity/associated sxs/prior Treatment) HPI   [name] is a 81 y.o. female here for evaluation of injuries and fall.  She describes a mechanical fall when she was opening her door and another person pulled it open, causing her to fall forward.  Injured her left foot, and struck her left chest, injuring it.  He was able to stand up and the port weight afterwards.  She typically uses a rolling walker, to help her ambulate.  She denies head, neck or back injury in the fall.  She did not have preceding symptoms.  She is taking her usual medications.  There are no other known modifying factors.   '   Past Medical History  Diagnosis Date  . GERD (gastroesophageal reflux disease)   . Irritable bowel syndrome     osteoporosis  . Hypertension   . Seizures   . Rheumatoid arthritis(714.0)   . Recurrent upper respiratory infection (URI)   . History of urinary frequency     and urgency after surgery  . Difficult intubation     From osteoprosis pt stated from prior surgery "They can hit the wrong spot and paralyze me"  . Cancer     Skin cancer; removed by dermatologist  . Seasonal allergies   . Blood transfusion     hx of with out reaction  . Interstitial cystitis    Past Surgical History  Procedure Laterality Date  . Abdominal hysterectomy  1974  . Appendectomy    . Cataract extraction, bilateral    . Tongue surgery      Multiple tongue surgeries  . Foot surgery      Bilateral feet  . Hip arthroplasty      Right   . Hip pinning      left upper thigh  . Colonoscopy      several colonoscopies  . Cardiac catheterization      In 2000; clean cath  . Breast surgery      Lump removed from right breast in 1989  . 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 Anesthesia problems Neg Hx   . Hypotension Neg Hx   . Malignant hyperthermia Neg Hx   . Pseudochol deficiency Neg Hx   . Dementia Mother   . Colon cancer Father    History  Substance Use Topics  . Smoking status: Former Smoker -- 0.00 packs/day    Types: Cigarettes  . Smokeless tobacco: Never Used  . Alcohol Use: No   OB History    No data available     Review of Systems  All other systems reviewed and are negative.     Allergies  Ciprofloxacin; Doxycycline; Keflex; Other; Prilosec; Protonix; Sulfa antibiotics; and Dilantin  Home Medications   Prior to Admission medications   Medication Sig Start Date End Date Taking? Authorizing Provider  adalimumab (HUMIRA) 40 MG/0.8ML injection Inject 40 mg into the skin once a week.    Historical Provider, MD  amLODipine (NORVASC) 10 MG tablet Take 10 mg by mouth  daily.    Historical Provider, MD  aspirin EC 81 MG tablet Take 162 mg by mouth daily as needed. For pain    Historical Provider, MD  azaTHIOprine (IMURAN) 50 MG tablet Take 25 mg by mouth daily.    Historical Provider, MD  calcitonin, salmon, (MIACALCIN/FORTICAL) 200 UNIT/ACT nasal spray Place 1 spray into the nose daily.    Historical Provider, MD  Cholecalciferol (D3-1000) 1000 UNITS capsule Take 1,000 Units by mouth daily.    Historical Provider, MD  CRANBERRY PO Take 1 tablet by mouth 2 (two) times daily.    Historical Provider, MD  FLUTICASONE PROPIONATE EX Apply 1 application topically daily as needed. For inflamation    Historical Provider, MD  Glucosamine-Chondroit-Vit C-Mn (GLUCOSAMINE-CHONDROITIN) CAPS Take 1 capsule by mouth 2 (two) times daily.    Historical Provider, MD  Glutamic Acid HCl (L-GLUTAMIC ACID) 500 MG TABS Take 1 tablet by mouth  daily.    Historical Provider, MD  LevOCARNitine (L-CARNITINE) 250 MG CAPS Take 1 capsule by mouth daily.    Historical Provider, MD  lisinopril (PRINIVIL,ZESTRIL) 5 MG tablet Take 5 mg by mouth daily.    Historical Provider, MD  loratadine (CLARITIN) 10 MG tablet Take 10 mg by mouth at bedtime.    Historical Provider, MD  Milk Thistle 150 MG CAPS Take 1 capsule by mouth daily.    Historical Provider, MD  Multiple Vitamin (MULITIVITAMIN WITH MINERALS) TABS Take 1 tablet by mouth 3 (three) times daily.    Historical Provider, MD  nitrofurantoin (MACRODANTIN) 50 MG capsule Take 50 mg by mouth at bedtime.    Historical Provider, MD  OVER THE COUNTER MEDICATION Take 1 capsule by mouth 2 (two) times daily. Ace's vitamin A-C-E with Selenium    Historical Provider, MD  OVER THE COUNTER MEDICATION Take 1-2 tablets by mouth 3 (three) times daily. Take 2 tablets in AM, 1 tablet at Noon, and 2 tablets in PM  Calcium-Magnesium-Vitamin D supplement    Historical Provider, MD  OVER THE COUNTER MEDICATION Take 2 capsules by mouth 4 (four) times daily as needed. For acid reflux   Mega-zyme digestive enzyme    Historical Provider, MD  OVER THE COUNTER MEDICATION Take 1 tablet by mouth daily as needed. For restless leg syndrom   Magnesium malate    Historical Provider, MD  PHENobarbital (LUMINAL) 64.8 MG tablet Take 64.8 mg by mouth 2 (two) times daily.    Historical Provider, MD  predniSONE (DELTASONE) 5 MG tablet Take 5 mg by mouth daily as needed. For rheumatoid flare-ups    Historical Provider, MD  vitamin C (ASCORBIC ACID) 500 MG tablet Take 500 mg by mouth daily.    Historical Provider, MD   BP 144/72 mmHg  Pulse 76  Temp(Src) 97.9 F (36.6 C) (Oral)  Resp 16  Ht 4\' 6"  (1.372 m)  Wt 97 lb 6.4 oz (44.18 kg)  BMI 23.47 kg/m2  SpO2 97% Physical Exam  Constitutional: She is oriented to person, place, and time. She appears well-developed.  Elderly, frail  HENT:  Head: Normocephalic and atraumatic.  Right  Ear: External ear normal.  Left Ear: External ear normal.  Eyes: Conjunctivae and EOM are normal. Pupils are equal, round, and reactive to light.  Neck: Normal range of motion and phonation normal. Neck supple.  Cardiovascular: Normal rate, regular rhythm and normal heart sounds.   Pulmonary/Chest: Effort normal and breath sounds normal. She exhibits tenderness (left lower chest wall, mild, without crepitation or deformity). She exhibits no  bony tenderness.  Abdominal: Soft. There is no tenderness.  Musculoskeletal: Normal range of motion.  Hand and wrist deformities consistent with rheumatoid arthritis.  Mild tenderness of the left lateral foot at the region of the proximal fifth metatarsal.  No deformity in this region.  Neurological: She is alert and oriented to person, place, and time. No cranial nerve deficit or sensory deficit. She exhibits normal muscle tone. Coordination normal.  Skin: Skin is warm, dry and intact.  Psychiatric: She has a normal mood and affect. Her behavior is normal. Judgment and thought content normal.  Nursing note and vitals reviewed.   ED Course  Procedures (including critical care time)  Findings discussed with patient and family member, all questions answered.   Labs Review Labs Reviewed - No data to display  Imaging Review Dg Ribs Unilateral W/chest Left  12/28/2014   CLINICAL DATA:  Initial encounter- pt states she fell coming out of the door while someone else was coming in the door. Pt fell on bricks hitting left side. Left posterior lower rib pain, marked w/ bb on rib images. HTN, ex-smoker. Hx cardiac cath.  EXAM: LEFT RIBS AND CHEST - 3+ VIEW  COMPARISON:  None.  FINDINGS: No rib fracture or rib lesion.  There are advanced arthropathic changes of the shoulder joints, left greater than right, suggesting rheumatoid arthritis. These are stable from the prior study.  Diffuse bony demineralization.  Clear lungs.  No pleural effusion or pneumothorax.   IMPRESSION: 1. No rib fracture or rib lesion. 2. No acute cardiopulmonary disease.   Electronically Signed   By: Lajean Manes M.D.   On: 12/28/2014 18:21   Dg Foot Complete Left  12/28/2014   CLINICAL DATA:  Status post fall. Lateral left foot pain. Initial encounter. History of rheumatoid arthritis.  EXAM: LEFT FOOT - COMPLETE 3+ VIEW  COMPARISON:  Plain films left foot 10/18/2009.  FINDINGS: The patient has a nondisplaced fracture through the proximal metaphysis of the fifth metatarsal. No other acute bony or joint abnormality is identified. Deformities of the toes and distal first, second and fourth metatarsals compatible with history of rheumatoid arthritis appear unchanged. Bones are osteopenic.  IMPRESSION: Nondisplaced fracture proximal fifth metatarsal. No other change compared to prior studies.   Electronically Signed   By: Inge Rise M.D.   On: 12/28/2014 18:22     EKG Interpretation None       MDM   Final diagnoses:  Contusion, chest wall, left, initial encounter  Fracture of metatarsal, left, closed, initial encounter    Fall, without serious injury.  Foot fracture which is nondisplaced, and is amenable to symptomatically treatment.  He will likely improve without any particular treatment beyond tightfitting shoe, and care with walking for 2-3 weeks.  I anticipate that she will follow-up with her PCP for problems with this injury.   Nursing Notes Reviewed/ Care Coordinated Applicable Imaging Reviewed Interpretation of Laboratory Data incorporated into ED treatment  The patient appears reasonably screened and/or stabilized for discharge and I doubt any other medical condition or other [facility] requiring further screening, evaluation, or treatment in the ED at this time prior to discharge.  Plan: Home Medications- usual; Home Treatments- rest; return here if the recommended treatment, does not improve the symptoms; Recommended follow up- PCP prn     Richarda Blade,  MD 12/28/14 [phone]

## 2014-12-28 NOTE — ED Notes (Signed)
Pt was attempting to open door for someone and they also opened door, throwing her off balance.  No loc.  Pt landed on L lower back.  1 inch hematoma noted to L lower back and pt states mild pain on inspiration.  Pt also c/o pain to L lat foot.  No deformity/bruising noted.

## 2014-12-28 NOTE — Discharge Instructions (Signed)
Use ice on the sore areas 3 or 4 times a day for 2 days. Take Tylenol for pain. When your walking, wear a solid shoe.   Chest Contusion A chest contusion is a deep bruise on your chest area. Contusions are the result of an injury that caused bleeding under the skin. A chest contusion may involve bruising of the skin, muscles, or ribs. The contusion may turn blue, purple, or yellow. Minor injuries will give you a painless contusion, but more severe contusions may stay painful and swollen for a few weeks. CAUSES  A contusion is usually caused by a blow, trauma, or direct force to an area of the body. SYMPTOMS   Swelling and redness of the injured area.  Discoloration of the injured area.  Tenderness and soreness of the injured area.  Pain. DIAGNOSIS  The diagnosis can be made by taking a history and performing a physical exam. An X-ray, CT scan, or MRI may be needed to determine if there were any associated injuries, such as broken bones (fractures) or internal injuries. TREATMENT  Often, the best treatment for a chest contusion is resting, icing, and applying cold compresses to the injured area. Deep breathing exercises may be recommended to reduce the risk of pneumonia. Over-the-counter medicines may also be recommended for pain control. HOME CARE INSTRUCTIONS   Put ice on the injured area.  Put ice in a plastic bag.  Place a towel between your skin and the bag.  Leave the ice on for 15-20 minutes, 03-04 times a day.  Only take over-the-counter or prescription medicines as directed by your caregiver. Your caregiver may recommend avoiding anti-inflammatory medicines (aspirin, ibuprofen, and naproxen) for 48 hours because these medicines may increase bruising.  Rest the injured area.  Perform deep-breathing exercises as directed by your caregiver.  Stop smoking if you smoke.  Do not lift objects over 5 pounds (2.3 kg) for 3 days or longer if recommended by your caregiver. SEEK  IMMEDIATE MEDICAL CARE IF:   You have increased bruising or swelling.  You have pain that is getting worse.  You have difficulty breathing.  You have dizziness, weakness, or fainting.  You have blood in your urine or stool.  You cough up or vomit blood.  Your swelling or pain is not relieved with medicines. MAKE SURE YOU:   Understand these instructions.  Will watch your condition.  Will get help right away if you are not doing well or get worse. Document Released: 07/22/2001 Document Revised: 07/21/2012 Document Reviewed: 04/19/2012 [facility] Patient Information 2015 [place], [place]. This information is not intended to replace advice given to you by your health care provider. Make sure you discuss any questions you have with your health care provider.  Metatarsal Fracture, Undisplaced A metatarsal fracture is a break in the bone(s) of the foot. These are the bones of the foot that connect your toes to the bones of the ankle. DIAGNOSIS  The diagnoses of these fractures are usually made with X-rays. If there are problems in the forefoot and x-rays are normal a later bone scan will usually make the diagnosis.  TREATMENT AND HOME CARE INSTRUCTIONS  Treatment may or may not include a cast or walking shoe. When casts are needed the use is usually for short periods of time so as not to slow down healing with muscle wasting (atrophy).  Activities should be stopped until further advised by your caregiver.  Wear shoes with adequate shock absorbing capabilities and stiff soles.  Alternative exercise  may be undertaken while waiting for healing. These may include bicycling and swimming, or as your caregiver suggests.  It is important to keep all follow-up visits or specialty referrals. The failure to keep these appointments could result in improper bone healing and chronic pain or disability.  Warning: Do not drive a car or operate a motor vehicle until your caregiver specifically tells  you it is safe to do so. IF YOU DO NOT HAVE A CAST OR SPLINT:  You may walk on your injured foot as tolerated or advised.  Do not put any weight on your injured foot for as long as directed by your caregiver. Slowly increase the amount of time you walk on the foot as the pain allows or as advised.  Use crutches until you can bear weight without pain. A gradual increase in weight bearing may help.  Apply ice to the injury for 15-20 minutes each hour while awake for the first 2 days. Put the ice in a plastic bag and place a towel between the bag of ice and your skin.  Only take over-the-counter or prescription medicines for pain, discomfort, or fever as directed by your caregiver. SEEK IMMEDIATE MEDICAL CARE IF:   Your cast gets damaged or breaks.  You have continued severe pain or more swelling than you did before the cast was put on, or the pain is not controlled with medications.  Your skin or nails below the injury turn blue or grey, or feel cold or numb.  There is a bad smell, or new stains or pus-like (purulent) drainage coming from the cast. MAKE SURE YOU:   Understand these instructions.  Will watch your condition.  Will get help right away if you are not doing well or get worse. Document Released: 07/19/2002 Document Revised: 01/19/2012 Document Reviewed: 06/09/2008 [facility] Patient Information 2015 [place], [place]. This information is not intended to replace advice given to you by your health care provider. Make sure you discuss any questions you have with your health care provider.

## 2014-12-28 NOTE — ED Notes (Signed)
Pt in x-ray at this time.  X-ray st's they will transport pt to A10 when finished in x-ray

## 2015-02-07 DIAGNOSIS — Z85810 Personal history of malignant neoplasm of tongue: Secondary | ICD-10-CM | POA: Diagnosis not present

## 2015-02-07 DIAGNOSIS — Z85818 Personal history of malignant neoplasm of other sites of lip, oral cavity, and pharynx: Secondary | ICD-10-CM | POA: Diagnosis not present

## 2015-02-14 DIAGNOSIS — Z85828 Personal history of other malignant neoplasm of skin: Secondary | ICD-10-CM | POA: Diagnosis not present

## 2015-02-14 DIAGNOSIS — L821 Other seborrheic keratosis: Secondary | ICD-10-CM | POA: Diagnosis not present

## 2015-02-14 DIAGNOSIS — L309 Dermatitis, unspecified: Secondary | ICD-10-CM | POA: Diagnosis not present

## 2015-02-14 DIAGNOSIS — Z08 Encounter for follow-up examination after completed treatment for malignant neoplasm: Secondary | ICD-10-CM | POA: Diagnosis not present

## 2015-02-27 DIAGNOSIS — N952 Postmenopausal atrophic vaginitis: Secondary | ICD-10-CM | POA: Diagnosis not present

## 2015-02-27 DIAGNOSIS — N3020 Other chronic cystitis without hematuria: Secondary | ICD-10-CM | POA: Diagnosis not present

## 2015-03-07 DIAGNOSIS — M0579 Rheumatoid arthritis with rheumatoid factor of multiple sites without organ or systems involvement: Secondary | ICD-10-CM | POA: Diagnosis not present

## 2015-03-07 DIAGNOSIS — M2550 Pain in unspecified joint: Secondary | ICD-10-CM | POA: Diagnosis not present

## 2015-03-07 DIAGNOSIS — Z79899 Other long term (current) drug therapy: Secondary | ICD-10-CM | POA: Diagnosis not present

## 2015-03-27 DIAGNOSIS — Z1389 Encounter for screening for other disorder: Secondary | ICD-10-CM | POA: Diagnosis not present

## 2015-03-27 DIAGNOSIS — C021 Malignant neoplasm of border of tongue: Secondary | ICD-10-CM | POA: Diagnosis not present

## 2015-03-27 DIAGNOSIS — Z0000 Encounter for general adult medical examination without abnormal findings: Secondary | ICD-10-CM | POA: Diagnosis not present

## 2015-03-27 DIAGNOSIS — Z23 Encounter for immunization: Secondary | ICD-10-CM | POA: Diagnosis not present

## 2015-03-27 DIAGNOSIS — M810 Age-related osteoporosis without current pathological fracture: Secondary | ICD-10-CM | POA: Diagnosis not present

## 2015-03-27 DIAGNOSIS — K219 Gastro-esophageal reflux disease without esophagitis: Secondary | ICD-10-CM | POA: Diagnosis not present

## 2015-03-27 DIAGNOSIS — G40909 Epilepsy, unspecified, not intractable, without status epilepticus: Secondary | ICD-10-CM | POA: Diagnosis not present

## 2015-03-27 DIAGNOSIS — I10 Essential (primary) hypertension: Secondary | ICD-10-CM | POA: Diagnosis not present

## 2015-05-01 DIAGNOSIS — M810 Age-related osteoporosis without current pathological fracture: Secondary | ICD-10-CM | POA: Diagnosis not present

## 2015-07-03 DIAGNOSIS — M810 Age-related osteoporosis without current pathological fracture: Secondary | ICD-10-CM | POA: Diagnosis not present

## 2015-07-03 DIAGNOSIS — I10 Essential (primary) hypertension: Secondary | ICD-10-CM | POA: Diagnosis not present

## 2015-07-10 DIAGNOSIS — M810 Age-related osteoporosis without current pathological fracture: Secondary | ICD-10-CM | POA: Diagnosis not present

## 2015-07-10 DIAGNOSIS — M2550 Pain in unspecified joint: Secondary | ICD-10-CM | POA: Diagnosis not present

## 2015-07-10 DIAGNOSIS — Z79899 Other long term (current) drug therapy: Secondary | ICD-10-CM | POA: Diagnosis not present

## 2015-07-10 DIAGNOSIS — M0589 Other rheumatoid arthritis with rheumatoid factor of multiple sites: Secondary | ICD-10-CM | POA: Diagnosis not present

## 2015-07-19 DIAGNOSIS — G40909 Epilepsy, unspecified, not intractable, without status epilepticus: Secondary | ICD-10-CM | POA: Diagnosis not present

## 2015-09-13 DIAGNOSIS — M1712 Unilateral primary osteoarthritis, left knee: Secondary | ICD-10-CM | POA: Diagnosis not present

## 2015-10-09 DIAGNOSIS — M2550 Pain in unspecified joint: Secondary | ICD-10-CM | POA: Diagnosis not present

## 2015-10-09 DIAGNOSIS — M0589 Other rheumatoid arthritis with rheumatoid factor of multiple sites: Secondary | ICD-10-CM | POA: Diagnosis not present

## 2015-10-09 DIAGNOSIS — Z79899 Other long term (current) drug therapy: Secondary | ICD-10-CM | POA: Diagnosis not present

## 2015-10-09 DIAGNOSIS — M810 Age-related osteoporosis without current pathological fracture: Secondary | ICD-10-CM | POA: Diagnosis not present

## 2015-11-14 DIAGNOSIS — C029 Malignant neoplasm of tongue, unspecified: Secondary | ICD-10-CM | POA: Diagnosis not present

## 2015-11-14 DIAGNOSIS — K0889 Other specified disorders of teeth and supporting structures: Secondary | ICD-10-CM | POA: Diagnosis not present

## 2015-11-14 DIAGNOSIS — C069 Malignant neoplasm of mouth, unspecified: Secondary | ICD-10-CM | POA: Diagnosis not present

## 2015-11-22 DIAGNOSIS — Z7952 Long term (current) use of systemic steroids: Secondary | ICD-10-CM | POA: Diagnosis not present

## 2015-11-22 DIAGNOSIS — M170 Bilateral primary osteoarthritis of knee: Secondary | ICD-10-CM | POA: Diagnosis not present

## 2015-11-22 DIAGNOSIS — R2689 Other abnormalities of gait and mobility: Secondary | ICD-10-CM | POA: Diagnosis not present

## 2015-11-22 DIAGNOSIS — I10 Essential (primary) hypertension: Secondary | ICD-10-CM | POA: Diagnosis not present

## 2015-11-22 DIAGNOSIS — M810 Age-related osteoporosis without current pathological fracture: Secondary | ICD-10-CM | POA: Diagnosis not present

## 2015-11-22 DIAGNOSIS — M21162 Varus deformity, not elsewhere classified, left knee: Secondary | ICD-10-CM | POA: Diagnosis not present

## 2015-11-22 DIAGNOSIS — M069 Rheumatoid arthritis, unspecified: Secondary | ICD-10-CM | POA: Diagnosis not present

## 2015-11-27 DIAGNOSIS — M810 Age-related osteoporosis without current pathological fracture: Secondary | ICD-10-CM | POA: Diagnosis not present

## 2015-11-27 DIAGNOSIS — M170 Bilateral primary osteoarthritis of knee: Secondary | ICD-10-CM | POA: Diagnosis not present

## 2015-11-27 DIAGNOSIS — M069 Rheumatoid arthritis, unspecified: Secondary | ICD-10-CM | POA: Diagnosis not present

## 2015-11-27 DIAGNOSIS — I10 Essential (primary) hypertension: Secondary | ICD-10-CM | POA: Diagnosis not present

## 2015-11-27 DIAGNOSIS — M21162 Varus deformity, not elsewhere classified, left knee: Secondary | ICD-10-CM | POA: Diagnosis not present

## 2015-11-27 DIAGNOSIS — R2689 Other abnormalities of gait and mobility: Secondary | ICD-10-CM | POA: Diagnosis not present

## 2015-11-29 DIAGNOSIS — M810 Age-related osteoporosis without current pathological fracture: Secondary | ICD-10-CM | POA: Diagnosis not present

## 2015-11-29 DIAGNOSIS — R2689 Other abnormalities of gait and mobility: Secondary | ICD-10-CM | POA: Diagnosis not present

## 2015-11-29 DIAGNOSIS — M069 Rheumatoid arthritis, unspecified: Secondary | ICD-10-CM | POA: Diagnosis not present

## 2015-11-29 DIAGNOSIS — M21162 Varus deformity, not elsewhere classified, left knee: Secondary | ICD-10-CM | POA: Diagnosis not present

## 2015-11-29 DIAGNOSIS — I10 Essential (primary) hypertension: Secondary | ICD-10-CM | POA: Diagnosis not present

## 2015-11-29 DIAGNOSIS — M170 Bilateral primary osteoarthritis of knee: Secondary | ICD-10-CM | POA: Diagnosis not present

## 2015-12-04 DIAGNOSIS — R2689 Other abnormalities of gait and mobility: Secondary | ICD-10-CM | POA: Diagnosis not present

## 2015-12-04 DIAGNOSIS — M170 Bilateral primary osteoarthritis of knee: Secondary | ICD-10-CM | POA: Diagnosis not present

## 2015-12-04 DIAGNOSIS — I10 Essential (primary) hypertension: Secondary | ICD-10-CM | POA: Diagnosis not present

## 2015-12-04 DIAGNOSIS — M069 Rheumatoid arthritis, unspecified: Secondary | ICD-10-CM | POA: Diagnosis not present

## 2015-12-04 DIAGNOSIS — M810 Age-related osteoporosis without current pathological fracture: Secondary | ICD-10-CM | POA: Diagnosis not present

## 2015-12-04 DIAGNOSIS — M21162 Varus deformity, not elsewhere classified, left knee: Secondary | ICD-10-CM | POA: Diagnosis not present

## 2015-12-11 DIAGNOSIS — M069 Rheumatoid arthritis, unspecified: Secondary | ICD-10-CM | POA: Diagnosis not present

## 2015-12-11 DIAGNOSIS — M810 Age-related osteoporosis without current pathological fracture: Secondary | ICD-10-CM | POA: Diagnosis not present

## 2015-12-11 DIAGNOSIS — M21162 Varus deformity, not elsewhere classified, left knee: Secondary | ICD-10-CM | POA: Diagnosis not present

## 2015-12-11 DIAGNOSIS — M170 Bilateral primary osteoarthritis of knee: Secondary | ICD-10-CM | POA: Diagnosis not present

## 2015-12-11 DIAGNOSIS — R2689 Other abnormalities of gait and mobility: Secondary | ICD-10-CM | POA: Diagnosis not present

## 2015-12-11 DIAGNOSIS — I10 Essential (primary) hypertension: Secondary | ICD-10-CM | POA: Diagnosis not present

## 2015-12-13 DIAGNOSIS — M21162 Varus deformity, not elsewhere classified, left knee: Secondary | ICD-10-CM | POA: Diagnosis not present

## 2015-12-13 DIAGNOSIS — M170 Bilateral primary osteoarthritis of knee: Secondary | ICD-10-CM | POA: Diagnosis not present

## 2015-12-13 DIAGNOSIS — R2689 Other abnormalities of gait and mobility: Secondary | ICD-10-CM | POA: Diagnosis not present

## 2015-12-13 DIAGNOSIS — M069 Rheumatoid arthritis, unspecified: Secondary | ICD-10-CM | POA: Diagnosis not present

## 2015-12-13 DIAGNOSIS — I10 Essential (primary) hypertension: Secondary | ICD-10-CM | POA: Diagnosis not present

## 2015-12-13 DIAGNOSIS — M810 Age-related osteoporosis without current pathological fracture: Secondary | ICD-10-CM | POA: Diagnosis not present

## 2015-12-18 DIAGNOSIS — M21162 Varus deformity, not elsewhere classified, left knee: Secondary | ICD-10-CM | POA: Diagnosis not present

## 2015-12-18 DIAGNOSIS — R2689 Other abnormalities of gait and mobility: Secondary | ICD-10-CM | POA: Diagnosis not present

## 2015-12-18 DIAGNOSIS — M170 Bilateral primary osteoarthritis of knee: Secondary | ICD-10-CM | POA: Diagnosis not present

## 2015-12-18 DIAGNOSIS — M810 Age-related osteoporosis without current pathological fracture: Secondary | ICD-10-CM | POA: Diagnosis not present

## 2015-12-18 DIAGNOSIS — I10 Essential (primary) hypertension: Secondary | ICD-10-CM | POA: Diagnosis not present

## 2015-12-18 DIAGNOSIS — M069 Rheumatoid arthritis, unspecified: Secondary | ICD-10-CM | POA: Diagnosis not present

## 2015-12-20 DIAGNOSIS — R2689 Other abnormalities of gait and mobility: Secondary | ICD-10-CM | POA: Diagnosis not present

## 2015-12-20 DIAGNOSIS — M810 Age-related osteoporosis without current pathological fracture: Secondary | ICD-10-CM | POA: Diagnosis not present

## 2015-12-20 DIAGNOSIS — M069 Rheumatoid arthritis, unspecified: Secondary | ICD-10-CM | POA: Diagnosis not present

## 2015-12-20 DIAGNOSIS — M170 Bilateral primary osteoarthritis of knee: Secondary | ICD-10-CM | POA: Diagnosis not present

## 2015-12-20 DIAGNOSIS — I10 Essential (primary) hypertension: Secondary | ICD-10-CM | POA: Diagnosis not present

## 2015-12-20 DIAGNOSIS — M21162 Varus deformity, not elsewhere classified, left knee: Secondary | ICD-10-CM | POA: Diagnosis not present

## 2015-12-25 DIAGNOSIS — M170 Bilateral primary osteoarthritis of knee: Secondary | ICD-10-CM | POA: Diagnosis not present

## 2015-12-25 DIAGNOSIS — M21162 Varus deformity, not elsewhere classified, left knee: Secondary | ICD-10-CM | POA: Diagnosis not present

## 2015-12-25 DIAGNOSIS — M810 Age-related osteoporosis without current pathological fracture: Secondary | ICD-10-CM | POA: Diagnosis not present

## 2015-12-25 DIAGNOSIS — I10 Essential (primary) hypertension: Secondary | ICD-10-CM | POA: Diagnosis not present

## 2015-12-25 DIAGNOSIS — R2689 Other abnormalities of gait and mobility: Secondary | ICD-10-CM | POA: Diagnosis not present

## 2015-12-25 DIAGNOSIS — M069 Rheumatoid arthritis, unspecified: Secondary | ICD-10-CM | POA: Diagnosis not present

## 2016-01-07 DIAGNOSIS — M0589 Other rheumatoid arthritis with rheumatoid factor of multiple sites: Secondary | ICD-10-CM | POA: Diagnosis not present

## 2016-01-07 DIAGNOSIS — Z79899 Other long term (current) drug therapy: Secondary | ICD-10-CM | POA: Diagnosis not present

## 2016-01-07 DIAGNOSIS — M2550 Pain in unspecified joint: Secondary | ICD-10-CM | POA: Diagnosis not present

## 2016-01-07 DIAGNOSIS — J988 Other specified respiratory disorders: Secondary | ICD-10-CM | POA: Diagnosis not present

## 2016-02-07 DIAGNOSIS — M1712 Unilateral primary osteoarthritis, left knee: Secondary | ICD-10-CM | POA: Diagnosis not present

## 2016-02-12 DIAGNOSIS — J069 Acute upper respiratory infection, unspecified: Secondary | ICD-10-CM | POA: Diagnosis not present

## 2016-02-12 DIAGNOSIS — B9789 Other viral agents as the cause of diseases classified elsewhere: Secondary | ICD-10-CM | POA: Diagnosis not present

## 2016-02-12 DIAGNOSIS — I10 Essential (primary) hypertension: Secondary | ICD-10-CM | POA: Diagnosis not present

## 2016-03-04 DIAGNOSIS — Z0000 Encounter for general adult medical examination without abnormal findings: Secondary | ICD-10-CM | POA: Diagnosis not present

## 2016-03-04 DIAGNOSIS — N3020 Other chronic cystitis without hematuria: Secondary | ICD-10-CM | POA: Diagnosis not present

## 2016-03-04 DIAGNOSIS — M6289 Other specified disorders of muscle: Secondary | ICD-10-CM | POA: Diagnosis not present

## 2016-04-10 DIAGNOSIS — M533 Sacrococcygeal disorders, not elsewhere classified: Secondary | ICD-10-CM | POA: Diagnosis not present

## 2016-04-10 DIAGNOSIS — M6281 Muscle weakness (generalized): Secondary | ICD-10-CM | POA: Diagnosis not present

## 2016-04-10 DIAGNOSIS — M62838 Other muscle spasm: Secondary | ICD-10-CM | POA: Diagnosis not present

## 2016-04-10 DIAGNOSIS — R278 Other lack of coordination: Secondary | ICD-10-CM | POA: Diagnosis not present

## 2016-04-10 DIAGNOSIS — M6289 Other specified disorders of muscle: Secondary | ICD-10-CM | POA: Diagnosis not present

## 2016-04-10 DIAGNOSIS — R32 Unspecified urinary incontinence: Secondary | ICD-10-CM | POA: Diagnosis not present

## 2016-04-29 DIAGNOSIS — Z79899 Other long term (current) drug therapy: Secondary | ICD-10-CM | POA: Diagnosis not present

## 2016-04-29 DIAGNOSIS — R05 Cough: Secondary | ICD-10-CM | POA: Diagnosis not present

## 2016-04-29 DIAGNOSIS — M2550 Pain in unspecified joint: Secondary | ICD-10-CM | POA: Diagnosis not present

## 2016-04-29 DIAGNOSIS — M0589 Other rheumatoid arthritis with rheumatoid factor of multiple sites: Secondary | ICD-10-CM | POA: Diagnosis not present

## 2016-05-05 DIAGNOSIS — K219 Gastro-esophageal reflux disease without esophagitis: Secondary | ICD-10-CM | POA: Diagnosis not present

## 2016-05-05 DIAGNOSIS — R05 Cough: Secondary | ICD-10-CM | POA: Diagnosis not present

## 2016-05-05 DIAGNOSIS — Z0000 Encounter for general adult medical examination without abnormal findings: Secondary | ICD-10-CM | POA: Diagnosis not present

## 2016-05-05 DIAGNOSIS — Z79899 Other long term (current) drug therapy: Secondary | ICD-10-CM | POA: Diagnosis not present

## 2016-05-05 DIAGNOSIS — G40909 Epilepsy, unspecified, not intractable, without status epilepticus: Secondary | ICD-10-CM | POA: Diagnosis not present

## 2016-05-05 DIAGNOSIS — I10 Essential (primary) hypertension: Secondary | ICD-10-CM | POA: Diagnosis not present

## 2016-05-05 DIAGNOSIS — Z1389 Encounter for screening for other disorder: Secondary | ICD-10-CM | POA: Diagnosis not present

## 2016-05-12 DIAGNOSIS — E222 Syndrome of inappropriate secretion of antidiuretic hormone: Secondary | ICD-10-CM | POA: Diagnosis not present

## 2016-05-26 DIAGNOSIS — R278 Other lack of coordination: Secondary | ICD-10-CM | POA: Diagnosis not present

## 2016-05-26 DIAGNOSIS — R102 Pelvic and perineal pain: Secondary | ICD-10-CM | POA: Diagnosis not present

## 2016-05-26 DIAGNOSIS — M6281 Muscle weakness (generalized): Secondary | ICD-10-CM | POA: Diagnosis not present

## 2016-05-26 DIAGNOSIS — R32 Unspecified urinary incontinence: Secondary | ICD-10-CM | POA: Diagnosis not present

## 2016-05-26 DIAGNOSIS — M62838 Other muscle spasm: Secondary | ICD-10-CM | POA: Diagnosis not present

## 2016-06-02 DIAGNOSIS — C069 Malignant neoplasm of mouth, unspecified: Secondary | ICD-10-CM | POA: Diagnosis not present

## 2016-06-02 DIAGNOSIS — R220 Localized swelling, mass and lump, head: Secondary | ICD-10-CM | POA: Diagnosis not present

## 2016-06-02 DIAGNOSIS — Z85810 Personal history of malignant neoplasm of tongue: Secondary | ICD-10-CM | POA: Diagnosis not present

## 2016-06-18 DIAGNOSIS — Z9889 Other specified postprocedural states: Secondary | ICD-10-CM | POA: Diagnosis not present

## 2016-06-18 DIAGNOSIS — J471 Bronchiectasis with (acute) exacerbation: Secondary | ICD-10-CM | POA: Diagnosis not present

## 2016-06-18 DIAGNOSIS — C060 Malignant neoplasm of cheek mucosa: Secondary | ICD-10-CM | POA: Diagnosis not present

## 2016-06-18 DIAGNOSIS — J479 Bronchiectasis, uncomplicated: Secondary | ICD-10-CM | POA: Diagnosis not present

## 2016-06-18 DIAGNOSIS — M47812 Spondylosis without myelopathy or radiculopathy, cervical region: Secondary | ICD-10-CM | POA: Diagnosis not present

## 2016-06-18 DIAGNOSIS — Z8739 Personal history of other diseases of the musculoskeletal system and connective tissue: Secondary | ICD-10-CM | POA: Diagnosis not present

## 2016-06-18 DIAGNOSIS — G893 Neoplasm related pain (acute) (chronic): Secondary | ICD-10-CM | POA: Diagnosis not present

## 2016-06-18 DIAGNOSIS — M519 Unspecified thoracic, thoracolumbar and lumbosacral intervertebral disc disorder: Secondary | ICD-10-CM | POA: Diagnosis not present

## 2016-06-18 DIAGNOSIS — R918 Other nonspecific abnormal finding of lung field: Secondary | ICD-10-CM | POA: Diagnosis not present

## 2016-07-01 DIAGNOSIS — Z8679 Personal history of other diseases of the circulatory system: Secondary | ICD-10-CM | POA: Diagnosis not present

## 2016-07-01 DIAGNOSIS — C029 Malignant neoplasm of tongue, unspecified: Secondary | ICD-10-CM | POA: Diagnosis not present

## 2016-07-01 DIAGNOSIS — I10 Essential (primary) hypertension: Secondary | ICD-10-CM | POA: Diagnosis not present

## 2016-07-01 DIAGNOSIS — M0530 Rheumatoid heart disease with rheumatoid arthritis of unspecified site: Secondary | ICD-10-CM | POA: Diagnosis not present

## 2016-07-01 DIAGNOSIS — R7989 Other specified abnormal findings of blood chemistry: Secondary | ICD-10-CM | POA: Diagnosis not present

## 2016-07-01 DIAGNOSIS — N3010 Interstitial cystitis (chronic) without hematuria: Secondary | ICD-10-CM | POA: Diagnosis not present

## 2016-07-01 DIAGNOSIS — M532X2 Spinal instabilities, cervical region: Secondary | ICD-10-CM | POA: Diagnosis not present

## 2016-07-01 DIAGNOSIS — C060 Malignant neoplasm of cheek mucosa: Secondary | ICD-10-CM | POA: Diagnosis not present

## 2016-07-01 DIAGNOSIS — Z01818 Encounter for other preprocedural examination: Secondary | ICD-10-CM | POA: Diagnosis not present

## 2016-07-10 DIAGNOSIS — N3010 Interstitial cystitis (chronic) without hematuria: Secondary | ICD-10-CM | POA: Diagnosis present

## 2016-07-10 DIAGNOSIS — C029 Malignant neoplasm of tongue, unspecified: Secondary | ICD-10-CM | POA: Diagnosis present

## 2016-07-10 DIAGNOSIS — C069 Malignant neoplasm of mouth, unspecified: Secondary | ICD-10-CM | POA: Diagnosis not present

## 2016-07-10 DIAGNOSIS — I10 Essential (primary) hypertension: Secondary | ICD-10-CM | POA: Diagnosis present

## 2016-07-10 DIAGNOSIS — M0530 Rheumatoid heart disease with rheumatoid arthritis of unspecified site: Secondary | ICD-10-CM | POA: Diagnosis present

## 2016-07-10 DIAGNOSIS — K029 Dental caries, unspecified: Secondary | ICD-10-CM | POA: Diagnosis not present

## 2016-07-10 DIAGNOSIS — C060 Malignant neoplasm of cheek mucosa: Secondary | ICD-10-CM | POA: Diagnosis not present

## 2016-07-10 DIAGNOSIS — M532X2 Spinal instabilities, cervical region: Secondary | ICD-10-CM | POA: Diagnosis present

## 2016-07-29 ENCOUNTER — Emergency Department (HOSPITAL_COMMUNITY): Payer: Medicare Other

## 2016-07-29 ENCOUNTER — Emergency Department (HOSPITAL_COMMUNITY)
Admission: EM | Admit: 2016-07-29 | Discharge: 2016-07-29 | Disposition: A | Discharge status: Home / Self Care | Payer: Medicare Other | Attending: Emergency Medicine | Admitting: Emergency Medicine

## 2016-07-29 ENCOUNTER — Encounter (HOSPITAL_COMMUNITY): Payer: Self-pay | Admitting: Emergency Medicine

## 2016-07-29 DIAGNOSIS — Z7982 Long term (current) use of aspirin: Secondary | ICD-10-CM | POA: Diagnosis not present

## 2016-07-29 DIAGNOSIS — Z85828 Personal history of other malignant neoplasm of skin: Secondary | ICD-10-CM | POA: Diagnosis not present

## 2016-07-29 DIAGNOSIS — Y9389 Activity, other specified: Secondary | ICD-10-CM | POA: Diagnosis not present

## 2016-07-29 DIAGNOSIS — I10 Essential (primary) hypertension: Secondary | ICD-10-CM | POA: Diagnosis not present

## 2016-07-29 DIAGNOSIS — M25551 Pain in right hip: Secondary | ICD-10-CM | POA: Diagnosis not present

## 2016-07-29 DIAGNOSIS — T148 Other injury of unspecified body region: Secondary | ICD-10-CM | POA: Diagnosis not present

## 2016-07-29 DIAGNOSIS — Z79899 Other long term (current) drug therapy: Secondary | ICD-10-CM | POA: Insufficient documentation

## 2016-07-29 DIAGNOSIS — W19XXXA Unspecified fall, initial encounter: Secondary | ICD-10-CM

## 2016-07-29 DIAGNOSIS — Z7952 Long term (current) use of systemic steroids: Secondary | ICD-10-CM | POA: Insufficient documentation

## 2016-07-29 DIAGNOSIS — W1839XA Other fall on same level, initial encounter: Secondary | ICD-10-CM | POA: Insufficient documentation

## 2016-07-29 DIAGNOSIS — Y999 Unspecified external cause status: Secondary | ICD-10-CM | POA: Insufficient documentation

## 2016-07-29 DIAGNOSIS — Y929 Unspecified place or not applicable: Secondary | ICD-10-CM | POA: Insufficient documentation

## 2016-07-29 DIAGNOSIS — M25552 Pain in left hip: Secondary | ICD-10-CM | POA: Diagnosis not present

## 2016-07-29 DIAGNOSIS — Z87891 Personal history of nicotine dependence: Secondary | ICD-10-CM | POA: Insufficient documentation

## 2016-07-29 MED ORDER — TETANUS-DIPHTH-ACELL PERTUSSIS 5-2.5-18.5 LF-MCG/0.5 IM SUSP: 0.5000 mL | Freq: Once | INTRAMUSCULAR | Status: DC

## 2016-07-29 MED ORDER — LIDOCAINE-EPINEPHRINE (PF) 2 %-1:200000 IJ SOLN
10.0000 mL | Freq: Once | INTRAMUSCULAR | Status: DC
Start: 2016-07-29 — End: 2016-07-29

## 2016-07-29 NOTE — ED Notes (Signed)
Bed: WA08 Expected date:  Expected time:  Means of arrival:  Comments: EMS 82 yo Fall Hip pain.

## 2016-07-29 NOTE — Discharge Instructions (Signed)
Please read and follow all provided instructions.  Your diagnoses today include:  1. Fall, initial encounter   2. Right hip pain     Tests performed today include: Vital signs. See below for your results today.   Medications prescribed:  Take as prescribed   Home care instructions:  Follow any educational materials contained in this packet.  Follow-up instructions: Please follow-up with your primary care provider for further evaluation of symptoms and treatment   Return instructions:  Please return to the Emergency Department if you do not get better, if you get worse, or new symptoms OR  - Fever (temperature greater than 101.68F)  - Bleeding that does not stop with holding pressure to the area    -Severe pain (please note that you may be more sore the day after your accident)  - Chest Pain  - Difficulty breathing  - Severe nausea or vomiting  - Inability to tolerate food and liquids  - Passing out  - Skin becoming red around your wounds  - Change in mental status (confusion or lethargy)  - New numbness or weakness    Please return if you have any other emergent concerns.  Additional Information:  Your vital signs today were: BP 132/68 (BP Location: Left Arm)    Pulse 84    Temp 97.7 F (36.5 C) (Oral)    Resp 18    SpO2 100%  If your blood pressure (BP) was elevated above 135/85 this visit, please have this repeated by your doctor within one month. ---------------

## 2016-07-29 NOTE — ED Triage Notes (Signed)
Per EMS, pt from home c/o right hip pain 2/10 after missing the chair and falling backwards. Hx hip replacement. Denies head injury and LOC.

## 2016-07-29 NOTE — ED Provider Notes (Signed)
[facility] DEPT Provider Note   CSN: [id number] Arrival date & time: 07/29/16  1716  History   Chief Complaint Chief Complaint  Patient presents with  . Hip Pain    HPI [name] is a 82 y.o. female.  HPI  82 y.o. female with a hx of HTN, presents to the Emergency Department today complaining of right hip pain s/p mechanical fall x 1 hour aog. Pt states that she was attempting to sit in her chair when she missed it from behind and landed on her right hip. Notes minimal pain on left hip. States pain is 2/10 at the moment without ambulation. Notes hx of right total hip replacement 28 years ago as well as left hip "plate." Pt has not ambulated since fall. No CP/SOB/ABD pain. No head trauma or LOC. No N/V. No other symptoms noted. Pt is not on blood thinners.   Ortho Surgeon- Dr. Alcide Clever  Past Medical History:  Diagnosis Date  . Blood transfusion    hx of with out reaction  . Cancer [facility])    Skin cancer; removed by dermatologist  . 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 GERD (gastroesophageal reflux disease)   . History of urinary frequency    and urgency after surgery  . Hypertension   . Interstitial cystitis   . Irritable bowel syndrome    osteoporosis  . Recurrent upper respiratory infection (URI)   . Rheumatoid arthritis(714.0)   . Seasonal allergies   . Seizures [facility])     Patient Active Problem List   Diagnosis Date Noted  . Leukocytosis 04/09/2012  . Pubic ramus fracture ([place]) 04/09/2012  . Hyponatremia 04/09/2012  . HTN (hypertension) 04/09/2012  . Rheumatoid arthritis(714.0) 04/09/2012    Past Surgical History:  Procedure Laterality Date  . ABDOMINAL HYSTERECTOMY  1974  . APPENDECTOMY    . BREAST SURGERY     Lump removed from right breast in 1989  . CARDIAC CATHETERIZATION     In 2000; clean cath  . CATARACT EXTRACTION, BILATERAL    . COLONOSCOPY     several  colonoscopies  . FOOT SURGERY     Bilateral feet  . HIP ARTHROPLASTY     Right   . HIP PINNING     left upper thigh  . 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 TONGUE SURGERY     Multiple tongue surgeries    OB History    No data available       Home Medications    Prior to Admission medications   Medication Sig Start Date End Date Taking? Authorizing Provider  adalimumab (HUMIRA) 40 MG/0.8ML injection Inject 40 mg into the skin once a week.    Historical Provider, MD  amLODipine (NORVASC) 10 MG tablet Take 10 mg by mouth daily.    Historical Provider, MD  aspirin EC 81 MG tablet Take 162 mg by mouth daily as needed. For pain    Historical Provider, MD  azaTHIOprine (IMURAN) 50 MG tablet Take 25 mg by mouth daily.    Historical Provider, MD  calcitonin, salmon, (MIACALCIN/FORTICAL) 200 UNIT/ACT nasal spray Place 1 spray into the nose daily.    Historical Provider, MD  Cholecalciferol (D3-1000) 1000 UNITS capsule Take 1,000 Units  by mouth daily.    Historical Provider, MD  CRANBERRY PO Take 1 tablet by mouth 2 (two) times daily.    Historical Provider, MD  FLUTICASONE PROPIONATE EX Apply 1 application topically daily as needed. For inflamation    Historical Provider, MD  Glucosamine-Chondroit-Vit C-Mn (GLUCOSAMINE-CHONDROITIN) CAPS Take 1 capsule by mouth 2 (two) times daily.    Historical Provider, MD  Glutamic Acid HCl (L-GLUTAMIC ACID) 500 MG TABS Take 1 tablet by mouth daily.    Historical Provider, MD  LevOCARNitine (L-CARNITINE) 250 MG CAPS Take 1 capsule by mouth daily.    Historical Provider, MD  lisinopril (PRINIVIL,ZESTRIL) 5 MG tablet Take 5 mg by mouth daily.    Historical Provider, MD  loratadine (CLARITIN) 10 MG tablet Take 10 mg by mouth at bedtime.    Historical Provider, MD  Milk Thistle 150 MG CAPS  Take 1 capsule by mouth daily.    Historical Provider, MD  Multiple Vitamin (MULITIVITAMIN WITH MINERALS) TABS Take 1 tablet by mouth 3 (three) times daily.    Historical Provider, MD  nitrofurantoin (MACRODANTIN) 50 MG capsule Take 50 mg by mouth at bedtime.    Historical Provider, MD  OVER THE COUNTER MEDICATION Take 1 capsule by mouth 2 (two) times daily. Ace's vitamin A-C-E with Selenium    Historical Provider, MD  OVER THE COUNTER MEDICATION Take 1-2 tablets by mouth 3 (three) times daily. Take 2 tablets in AM, 1 tablet at Noon, and 2 tablets in PM  Calcium-Magnesium-Vitamin D supplement    Historical Provider, MD  OVER THE COUNTER MEDICATION Take 2 capsules by mouth 4 (four) times daily as needed. For acid reflux   Mega-zyme digestive enzyme    Historical Provider, MD  OVER THE COUNTER MEDICATION Take 1 tablet by mouth daily as needed. For restless leg syndrom   Magnesium malate    Historical Provider, MD  PHENobarbital (LUMINAL) 64.8 MG tablet Take 64.8 mg by mouth 2 (two) times daily.    Historical Provider, MD  predniSONE (DELTASONE) 5 MG tablet Take 5 mg by mouth daily as needed. For rheumatoid flare-ups    Historical Provider, MD  vitamin C (ASCORBIC ACID) 500 MG tablet Take 500 mg by mouth daily.    Historical Provider, MD    Family History Family History  Problem Relation Age of Onset  . Dementia Mother   . Colon cancer Father   . Anesthesia problems Neg Hx   . Hypotension Neg Hx   . Malignant hyperthermia Neg Hx   . Pseudochol deficiency Neg Hx     Social History Social History  Substance Use Topics  . Smoking status: Former Smoker    Packs/day: 0.00    Types: Cigarettes  . Smokeless tobacco: Never Used  . Alcohol use No     Allergies   Ciprofloxacin; Doxycycline; Keflex [cephalexin]; Other; Prilosec [omeprazole]; Protonix [pantoprazole sodium]; Sulfa antibiotics; and Dilantin [phenytoin sodium extended]   Review of Systems Review of Systems ROS reviewed and all  are negative for acute change except as noted in the HPI.  Physical Exam Updated Vital Signs SpO2 95%   Physical Exam  Constitutional: She is oriented to person, place, and time. Vital signs are normal. She appears well-developed and well-nourished.  HENT:  Head: Normocephalic.  Right Ear: Hearing normal.  Left Ear: Hearing normal.  Eyes: Conjunctivae and EOM are normal. Pupils are equal, round, and reactive to light.  Cardiovascular: Normal rate, regular rhythm, normal heart sounds and intact distal pulses.   Pulmonary/Chest:  Effort normal and breath sounds normal.  Musculoskeletal:       Right hip: Normal. She exhibits normal range of motion, normal strength, no tenderness, no bony tenderness, no swelling and no deformity.       Left hip: Normal. She exhibits normal range of motion, normal strength, no tenderness, no bony tenderness and no deformity.  BLE NVI. Motor/sensation intact. Interna/external rotation of hips do not elicit pain response. Motor/sensation intact. No shortening of either leg. No visible hematoma or skin dislocation.   Neurological: She is alert and oriented to person, place, and time.  Skin: Skin is warm and dry.  Psychiatric: She has a normal mood and affect. Her speech is normal and behavior is normal. Thought content normal.  Nursing note and vitals reviewed.  ED Treatments / Results  Labs (all labs ordered are listed, but only abnormal results are displayed) Labs Reviewed - No data to display  EKG  EKG Interpretation None       Radiology Dg Hips Bilat W Or Wo Pelvis 3-4 Views  Result Date: 07/29/2016 CLINICAL DATA:  82 year old female with hip and lumbar back pain after a fall today. Right greater than left hip pain. Initial encounter. EXAM: DG HIP (WITH OR WITHOUT PELVIS) 3-4V BILAT COMPARISON:  Pelvis CT 04/09/2012. FINDINGS: Osteopenia. Chronic left proximal femur dynamic hip screw and chronic right hip arthroplasty hardware appears stable since  2013 and intact. Right hip arthroplasty is normally aligned. The left femoral head remains normally located. Chronic dystrophic versus greater trochanter avulsion fragment along the left femoral neck is stable since 2013. No acute proximal femur fracture identified. Chronic bilateral pubic rami fractures. No definite acute hip fracture. Superimposed extensive Aortoiliac and bilateral femoral calcified atherosclerosis. IMPRESSION: 1. Osteopenia with chronic bilateral pubic rami fractures and chronic postoperative changes to both hips. No definite acute fracture or dislocation identified. 2.  Calcified aortic atherosclerosis. Electronically Signed   By: Genevie Ann M.D.   On: 07/29/2016 18:05    Procedures Procedures (including critical care time)  Medications Ordered in ED Medications - No data to display   Initial Impression / Assessment and Plan / ED Course  I have reviewed the triage vital signs and the nursing notes.  Pertinent labs & imaging results that were available during my care of the patient were reviewed by me and considered in my medical decision making (see chart for details).  Clinical Course   Final Clinical Impressions(s) / ED Diagnoses  I have reviewed and evaluated the relevant imaging studies.  I have reviewed the relevant previous healthcare records. I have reviewed EMS Documentation. I obtained HPI from historian. Patient discussed with supervising physician  ED Course:  Assessment: Pt is a 13yF with hx HTn who presents with right hip pain s/p mechanical fall. No blodo thinners. On exam, pt in NAD. Nontoxic/nonseptic appearing. VSS. Afebrile. Lungs CTA. Heart RRR. Bilateral hips NVI with motor/sensation intact. No obvious shortening. Internal/external rotation intact without pain. Imaging with no acute abnormalities. No dislocation. Seen by supervising physician. Pt ambulated in ED. Plan is to [facility] with follow up to PCP. At time of discharge, Patient is in no acute  distress. Vital Signs are stable. Patient is able to ambulate. Patient able to tolerate PO.    Disposition/Plan:  DC Home Additional Verbal discharge instructions given and discussed with patient.  Pt Instructed to f/u with PCP in the next week for evaluation and treatment of symptoms. Return precautions given Pt acknowledges and agrees with plan  Supervising Physician Nat Christen, MD   Final diagnoses:  Fall, initial encounter  Right hip pain    New Prescriptions New Prescriptions   No medications on file     Shary Decamp, PA-C 07/29/16 1857    Nat Christen, MD 07/30/16 [phone]

## 2016-07-29 NOTE — ED Notes (Signed)
Patient transported to X-ray 

## 2016-07-29 NOTE — ED Notes (Signed)
Pt ambulated with walker without difficulty.

## 2016-08-04 DIAGNOSIS — Z483 Aftercare following surgery for neoplasm: Secondary | ICD-10-CM | POA: Diagnosis not present

## 2016-08-04 DIAGNOSIS — R202 Paresthesia of skin: Secondary | ICD-10-CM | POA: Diagnosis not present

## 2016-08-04 DIAGNOSIS — R200 Anesthesia of skin: Secondary | ICD-10-CM | POA: Diagnosis not present

## 2016-08-04 DIAGNOSIS — C060 Malignant neoplasm of cheek mucosa: Secondary | ICD-10-CM | POA: Diagnosis not present

## 2016-08-06 DIAGNOSIS — M6281 Muscle weakness (generalized): Secondary | ICD-10-CM | POA: Diagnosis not present

## 2016-08-06 DIAGNOSIS — M069 Rheumatoid arthritis, unspecified: Secondary | ICD-10-CM | POA: Diagnosis not present

## 2016-08-06 DIAGNOSIS — I10 Essential (primary) hypertension: Secondary | ICD-10-CM | POA: Diagnosis not present

## 2016-08-06 DIAGNOSIS — Z9181 History of falling: Secondary | ICD-10-CM | POA: Diagnosis not present

## 2016-08-06 DIAGNOSIS — M21162 Varus deformity, not elsewhere classified, left knee: Secondary | ICD-10-CM | POA: Diagnosis not present

## 2016-08-06 DIAGNOSIS — G40909 Epilepsy, unspecified, not intractable, without status epilepticus: Secondary | ICD-10-CM | POA: Diagnosis not present

## 2016-08-07 DIAGNOSIS — M21162 Varus deformity, not elsewhere classified, left knee: Secondary | ICD-10-CM | POA: Diagnosis not present

## 2016-08-07 DIAGNOSIS — Z9181 History of falling: Secondary | ICD-10-CM | POA: Diagnosis not present

## 2016-08-07 DIAGNOSIS — M069 Rheumatoid arthritis, unspecified: Secondary | ICD-10-CM | POA: Diagnosis not present

## 2016-08-07 DIAGNOSIS — I10 Essential (primary) hypertension: Secondary | ICD-10-CM | POA: Diagnosis not present

## 2016-08-07 DIAGNOSIS — G40909 Epilepsy, unspecified, not intractable, without status epilepticus: Secondary | ICD-10-CM | POA: Diagnosis not present

## 2016-08-07 DIAGNOSIS — M6281 Muscle weakness (generalized): Secondary | ICD-10-CM | POA: Diagnosis not present

## 2016-08-12 DIAGNOSIS — R531 Weakness: Secondary | ICD-10-CM | POA: Diagnosis not present

## 2016-08-12 DIAGNOSIS — E538 Deficiency of other specified B group vitamins: Secondary | ICD-10-CM | POA: Diagnosis not present

## 2016-08-12 DIAGNOSIS — R05 Cough: Secondary | ICD-10-CM | POA: Diagnosis not present

## 2016-08-12 DIAGNOSIS — M0589 Other rheumatoid arthritis with rheumatoid factor of multiple sites: Secondary | ICD-10-CM | POA: Diagnosis not present

## 2016-08-12 DIAGNOSIS — Z79899 Other long term (current) drug therapy: Secondary | ICD-10-CM | POA: Diagnosis not present

## 2016-08-12 DIAGNOSIS — M2550 Pain in unspecified joint: Secondary | ICD-10-CM | POA: Diagnosis not present

## 2016-08-14 DIAGNOSIS — M6281 Muscle weakness (generalized): Secondary | ICD-10-CM | POA: Diagnosis not present

## 2016-08-14 DIAGNOSIS — Z9181 History of falling: Secondary | ICD-10-CM | POA: Diagnosis not present

## 2016-08-14 DIAGNOSIS — M069 Rheumatoid arthritis, unspecified: Secondary | ICD-10-CM | POA: Diagnosis not present

## 2016-08-14 DIAGNOSIS — I10 Essential (primary) hypertension: Secondary | ICD-10-CM | POA: Diagnosis not present

## 2016-08-14 DIAGNOSIS — M21162 Varus deformity, not elsewhere classified, left knee: Secondary | ICD-10-CM | POA: Diagnosis not present

## 2016-08-14 DIAGNOSIS — G40909 Epilepsy, unspecified, not intractable, without status epilepticus: Secondary | ICD-10-CM | POA: Diagnosis not present

## 2016-08-15 DIAGNOSIS — I10 Essential (primary) hypertension: Secondary | ICD-10-CM | POA: Diagnosis not present

## 2016-08-15 DIAGNOSIS — R05 Cough: Secondary | ICD-10-CM | POA: Diagnosis not present

## 2016-08-15 DIAGNOSIS — R5383 Other fatigue: Secondary | ICD-10-CM | POA: Diagnosis not present

## 2016-08-15 DIAGNOSIS — Z23 Encounter for immunization: Secondary | ICD-10-CM | POA: Diagnosis not present

## 2016-08-15 DIAGNOSIS — R202 Paresthesia of skin: Secondary | ICD-10-CM | POA: Diagnosis not present

## 2016-08-18 DIAGNOSIS — G40909 Epilepsy, unspecified, not intractable, without status epilepticus: Secondary | ICD-10-CM | POA: Diagnosis not present

## 2016-08-18 DIAGNOSIS — M069 Rheumatoid arthritis, unspecified: Secondary | ICD-10-CM | POA: Diagnosis not present

## 2016-08-18 DIAGNOSIS — M6281 Muscle weakness (generalized): Secondary | ICD-10-CM | POA: Diagnosis not present

## 2016-08-18 DIAGNOSIS — M21162 Varus deformity, not elsewhere classified, left knee: Secondary | ICD-10-CM | POA: Diagnosis not present

## 2016-08-18 DIAGNOSIS — I10 Essential (primary) hypertension: Secondary | ICD-10-CM | POA: Diagnosis not present

## 2016-08-18 DIAGNOSIS — Z9181 History of falling: Secondary | ICD-10-CM | POA: Diagnosis not present

## 2016-08-20 DIAGNOSIS — N3020 Other chronic cystitis without hematuria: Secondary | ICD-10-CM | POA: Diagnosis not present

## 2016-08-21 DIAGNOSIS — I10 Essential (primary) hypertension: Secondary | ICD-10-CM | POA: Diagnosis not present

## 2016-08-21 DIAGNOSIS — M6281 Muscle weakness (generalized): Secondary | ICD-10-CM | POA: Diagnosis not present

## 2016-08-21 DIAGNOSIS — Z9181 History of falling: Secondary | ICD-10-CM | POA: Diagnosis not present

## 2016-08-21 DIAGNOSIS — M069 Rheumatoid arthritis, unspecified: Secondary | ICD-10-CM | POA: Diagnosis not present

## 2016-08-21 DIAGNOSIS — G40909 Epilepsy, unspecified, not intractable, without status epilepticus: Secondary | ICD-10-CM | POA: Diagnosis not present

## 2016-08-21 DIAGNOSIS — M21162 Varus deformity, not elsewhere classified, left knee: Secondary | ICD-10-CM | POA: Diagnosis not present

## 2016-08-25 DIAGNOSIS — M069 Rheumatoid arthritis, unspecified: Secondary | ICD-10-CM | POA: Diagnosis not present

## 2016-08-25 DIAGNOSIS — M6281 Muscle weakness (generalized): Secondary | ICD-10-CM | POA: Diagnosis not present

## 2016-08-25 DIAGNOSIS — G40909 Epilepsy, unspecified, not intractable, without status epilepticus: Secondary | ICD-10-CM | POA: Diagnosis not present

## 2016-08-25 DIAGNOSIS — I10 Essential (primary) hypertension: Secondary | ICD-10-CM | POA: Diagnosis not present

## 2016-08-25 DIAGNOSIS — M21162 Varus deformity, not elsewhere classified, left knee: Secondary | ICD-10-CM | POA: Diagnosis not present

## 2016-08-25 DIAGNOSIS — Z9181 History of falling: Secondary | ICD-10-CM | POA: Diagnosis not present

## 2016-08-28 DIAGNOSIS — M6281 Muscle weakness (generalized): Secondary | ICD-10-CM | POA: Diagnosis not present

## 2016-08-28 DIAGNOSIS — G40909 Epilepsy, unspecified, not intractable, without status epilepticus: Secondary | ICD-10-CM | POA: Diagnosis not present

## 2016-08-28 DIAGNOSIS — M21162 Varus deformity, not elsewhere classified, left knee: Secondary | ICD-10-CM | POA: Diagnosis not present

## 2016-08-28 DIAGNOSIS — M069 Rheumatoid arthritis, unspecified: Secondary | ICD-10-CM | POA: Diagnosis not present

## 2016-08-28 DIAGNOSIS — Z9181 History of falling: Secondary | ICD-10-CM | POA: Diagnosis not present

## 2016-08-28 DIAGNOSIS — I10 Essential (primary) hypertension: Secondary | ICD-10-CM | POA: Diagnosis not present

## 2016-09-02 DIAGNOSIS — M069 Rheumatoid arthritis, unspecified: Secondary | ICD-10-CM | POA: Diagnosis not present

## 2016-09-02 DIAGNOSIS — M6281 Muscle weakness (generalized): Secondary | ICD-10-CM | POA: Diagnosis not present

## 2016-09-02 DIAGNOSIS — G40909 Epilepsy, unspecified, not intractable, without status epilepticus: Secondary | ICD-10-CM | POA: Diagnosis not present

## 2016-09-02 DIAGNOSIS — M21162 Varus deformity, not elsewhere classified, left knee: Secondary | ICD-10-CM | POA: Diagnosis not present

## 2016-09-02 DIAGNOSIS — I10 Essential (primary) hypertension: Secondary | ICD-10-CM | POA: Diagnosis not present

## 2016-09-02 DIAGNOSIS — Z9181 History of falling: Secondary | ICD-10-CM | POA: Diagnosis not present

## 2016-09-04 DIAGNOSIS — N390 Urinary tract infection, site not specified: Secondary | ICD-10-CM | POA: Diagnosis not present

## 2016-09-04 DIAGNOSIS — Z9181 History of falling: Secondary | ICD-10-CM | POA: Diagnosis not present

## 2016-09-04 DIAGNOSIS — G40909 Epilepsy, unspecified, not intractable, without status epilepticus: Secondary | ICD-10-CM | POA: Diagnosis not present

## 2016-09-04 DIAGNOSIS — M21162 Varus deformity, not elsewhere classified, left knee: Secondary | ICD-10-CM | POA: Diagnosis not present

## 2016-09-04 DIAGNOSIS — M6281 Muscle weakness (generalized): Secondary | ICD-10-CM | POA: Diagnosis not present

## 2016-09-04 DIAGNOSIS — M069 Rheumatoid arthritis, unspecified: Secondary | ICD-10-CM | POA: Diagnosis not present

## 2016-09-04 DIAGNOSIS — I10 Essential (primary) hypertension: Secondary | ICD-10-CM | POA: Diagnosis not present

## 2016-09-05 DIAGNOSIS — R05 Cough: Secondary | ICD-10-CM | POA: Diagnosis not present

## 2016-09-05 DIAGNOSIS — I10 Essential (primary) hypertension: Secondary | ICD-10-CM | POA: Diagnosis not present

## 2016-09-08 DIAGNOSIS — Z9181 History of falling: Secondary | ICD-10-CM | POA: Diagnosis not present

## 2016-09-08 DIAGNOSIS — I10 Essential (primary) hypertension: Secondary | ICD-10-CM | POA: Diagnosis not present

## 2016-09-08 DIAGNOSIS — M069 Rheumatoid arthritis, unspecified: Secondary | ICD-10-CM | POA: Diagnosis not present

## 2016-09-08 DIAGNOSIS — M6281 Muscle weakness (generalized): Secondary | ICD-10-CM | POA: Diagnosis not present

## 2016-09-08 DIAGNOSIS — M21162 Varus deformity, not elsewhere classified, left knee: Secondary | ICD-10-CM | POA: Diagnosis not present

## 2016-09-08 DIAGNOSIS — G40909 Epilepsy, unspecified, not intractable, without status epilepticus: Secondary | ICD-10-CM | POA: Diagnosis not present

## 2016-09-10 DIAGNOSIS — L818 Other specified disorders of pigmentation: Secondary | ICD-10-CM | POA: Diagnosis not present

## 2016-09-10 DIAGNOSIS — Z9889 Other specified postprocedural states: Secondary | ICD-10-CM | POA: Diagnosis not present

## 2016-09-15 DIAGNOSIS — G40909 Epilepsy, unspecified, not intractable, without status epilepticus: Secondary | ICD-10-CM | POA: Diagnosis not present

## 2016-09-15 DIAGNOSIS — Z9181 History of falling: Secondary | ICD-10-CM | POA: Diagnosis not present

## 2016-09-15 DIAGNOSIS — M6281 Muscle weakness (generalized): Secondary | ICD-10-CM | POA: Diagnosis not present

## 2016-09-15 DIAGNOSIS — I10 Essential (primary) hypertension: Secondary | ICD-10-CM | POA: Diagnosis not present

## 2016-09-15 DIAGNOSIS — M21162 Varus deformity, not elsewhere classified, left knee: Secondary | ICD-10-CM | POA: Diagnosis not present

## 2016-09-15 DIAGNOSIS — M069 Rheumatoid arthritis, unspecified: Secondary | ICD-10-CM | POA: Diagnosis not present

## 2016-09-26 ENCOUNTER — Other Ambulatory Visit: Payer: Self-pay | Admitting: Nurse Practitioner

## 2016-09-26 ENCOUNTER — Ambulatory Visit
Admission: RE | Admit: 2016-09-26 | Discharge: 2016-09-26 | Disposition: A | Discharge status: Home / Self Care | Payer: Medicare Other | Source: Ambulatory Visit | Attending: Nurse Practitioner | Admitting: Nurse Practitioner

## 2016-09-26 DIAGNOSIS — R05 Cough: Secondary | ICD-10-CM | POA: Diagnosis not present

## 2016-09-26 DIAGNOSIS — R059 Cough, unspecified: Secondary | ICD-10-CM

## 2016-09-29 DIAGNOSIS — M21162 Varus deformity, not elsewhere classified, left knee: Secondary | ICD-10-CM | POA: Diagnosis not present

## 2016-09-29 DIAGNOSIS — Z9181 History of falling: Secondary | ICD-10-CM | POA: Diagnosis not present

## 2016-09-29 DIAGNOSIS — M069 Rheumatoid arthritis, unspecified: Secondary | ICD-10-CM | POA: Diagnosis not present

## 2016-09-29 DIAGNOSIS — G40909 Epilepsy, unspecified, not intractable, without status epilepticus: Secondary | ICD-10-CM | POA: Diagnosis not present

## 2016-09-29 DIAGNOSIS — I10 Essential (primary) hypertension: Secondary | ICD-10-CM | POA: Diagnosis not present

## 2016-09-29 DIAGNOSIS — M6281 Muscle weakness (generalized): Secondary | ICD-10-CM | POA: Diagnosis not present

## 2016-10-14 ENCOUNTER — Telehealth: Payer: Self-pay | Admitting: Pulmonary Disease

## 2016-10-14 DIAGNOSIS — J410 Simple chronic bronchitis: Secondary | ICD-10-CM | POA: Diagnosis not present

## 2016-10-14 DIAGNOSIS — I10 Essential (primary) hypertension: Secondary | ICD-10-CM | POA: Diagnosis not present

## 2016-10-14 DIAGNOSIS — M0579 Rheumatoid arthritis with rheumatoid factor of multiple sites without organ or systems involvement: Secondary | ICD-10-CM | POA: Diagnosis not present

## 2016-10-14 NOTE — Telephone Encounter (Signed)
[name] left direct # [phone] to speak about getting the patient scheduled.Mearl Latin

## 2016-10-14 NOTE — Telephone Encounter (Signed)
BQ ----Dr. Felipa Eth has referred this pt to our office---specifically wanted you to see this pt---ASAP for persistant bronchial cough---you have no soon openings. Any recs or can you work this pt in?  Please advise. thanks

## 2016-10-17 ENCOUNTER — Institutional Professional Consult (permissible substitution): Payer: Medicare Other | Admitting: Internal Medicine

## 2016-10-20 DIAGNOSIS — J410 Simple chronic bronchitis: Secondary | ICD-10-CM | POA: Diagnosis not present

## 2016-10-20 DIAGNOSIS — R2689 Other abnormalities of gait and mobility: Secondary | ICD-10-CM | POA: Diagnosis not present

## 2016-10-20 DIAGNOSIS — M0579 Rheumatoid arthritis with rheumatoid factor of multiple sites without organ or systems involvement: Secondary | ICD-10-CM | POA: Diagnosis not present

## 2016-10-20 DIAGNOSIS — M6281 Muscle weakness (generalized): Secondary | ICD-10-CM | POA: Diagnosis not present

## 2016-10-20 DIAGNOSIS — I10 Essential (primary) hypertension: Secondary | ICD-10-CM | POA: Diagnosis not present

## 2016-10-20 NOTE — Telephone Encounter (Signed)
BQ is booked until 12-11-16.Marland KitchenMarland KitchenCaryl Pina please check with BQ on 10-21-16 to see if he would like to double book or if there is another MD he would recommend. Thanks.

## 2016-10-21 NOTE — Telephone Encounter (Signed)
BQ please advise if you can work this patient in, or if they need to see another provider. Referring provider has requested you specifically but you have no openings until February 2018.  Thanks!

## 2016-10-23 DIAGNOSIS — M6281 Muscle weakness (generalized): Secondary | ICD-10-CM | POA: Diagnosis not present

## 2016-10-23 DIAGNOSIS — J410 Simple chronic bronchitis: Secondary | ICD-10-CM | POA: Diagnosis not present

## 2016-10-23 DIAGNOSIS — R2689 Other abnormalities of gait and mobility: Secondary | ICD-10-CM | POA: Diagnosis not present

## 2016-10-23 DIAGNOSIS — I10 Essential (primary) hypertension: Secondary | ICD-10-CM | POA: Diagnosis not present

## 2016-10-23 DIAGNOSIS — M0579 Rheumatoid arthritis with rheumatoid factor of multiple sites without organ or systems involvement: Secondary | ICD-10-CM | POA: Diagnosis not present

## 2016-10-23 NOTE — Telephone Encounter (Signed)
Yes  OK to work in tomorrow 12/15 as 1:30 as only patient

## 2016-10-23 NOTE — Telephone Encounter (Signed)
Pt already scheduled with MW- confirmed this with Tammy at [facility] office  Nothing further needed

## 2016-10-24 ENCOUNTER — Ambulatory Visit (INDEPENDENT_AMBULATORY_CARE_PROVIDER_SITE_OTHER): Payer: Medicare Other | Admitting: Internal Medicine

## 2016-10-24 ENCOUNTER — Encounter: Payer: Self-pay | Admitting: Internal Medicine

## 2016-10-24 VITALS — BP 102/70 | HR 88 | Ht <= 58 in | Wt 92.0 lb

## 2016-10-24 DIAGNOSIS — I10 Essential (primary) hypertension: Secondary | ICD-10-CM | POA: Diagnosis not present

## 2016-10-24 DIAGNOSIS — M069 Rheumatoid arthritis, unspecified: Secondary | ICD-10-CM

## 2016-10-24 DIAGNOSIS — R05 Cough: Secondary | ICD-10-CM

## 2016-10-24 DIAGNOSIS — R058 Other specified cough: Secondary | ICD-10-CM

## 2016-10-24 MED ORDER — BENZONATATE 100 MG PO CAPS: 100.0000 mg | ORAL_CAPSULE | Freq: Four times a day (QID) | ORAL | 1 refills | Status: DC | PRN

## 2016-10-24 MED ORDER — VALSARTAN 80 MG PO TABS
80.0000 mg | ORAL_TABLET | Freq: Every day | ORAL | 11 refills | Status: AC
Start: 2016-10-24 — End: 2017-10-24

## 2016-10-24 NOTE — Progress Notes (Signed)
Subjective:    Patient ID: [name], female    DOB: 1931/07/26,   MRN: [id number]  HPI  73 yowf minimal smoking hx and dx RA since 1985 followedb by [facility] referred to pulmonary clinic 10/24/2016 by Dr   Felipa Eth for cough.   10/24/2016 1st [facility] office visit/ Abrina Petz   On macrodantin and acei  Chief Complaint  Patient presents with  . Pulmonary Consult    first started with a tickle in throat  a year ago, cough but unable to bring any mucus up  from [place] and moved to [place] around 2010 and developed "allergy symtpoms" around 1-2 years prior to OV  Then tickle in throat progressed down in chest  And feels there;s mucus always there but can't bring it up, does not however disturb sleep or exac in ams on awakening and no assoc nasal disharge/itching or sneezing- no better on pred for RA or zantac for gerd as can't take ppi . / no better with  clariton  - some better with tessalon   No obvious day to day or daytime variability or assoc excess/ purulent sputum or mucus plugs or hemoptysis or cp or chest tightness, subjective wheeze or overt sinus or hb symptoms. No unusual exp hx or h/o childhood pna/ asthma or knowledge of premature birth.  Sleeping ok without nocturnal  or early am exacerbation  of respiratory  c/o's or need for noct saba. Also denies any obvious fluctuation of symptoms with weather or environmental changes or other aggravating or alleviating factors except as outlined above   Current Medications, Allergies, Complete Past Medical History, Past Surgical History, Family History, and Social History were reviewed in [employer] record.             Review of Systems  Constitutional: Negative for chills, fever and unexpected weight change.  HENT: Negative for congestion, dental problem, ear pain, nosebleeds, postnasal drip, rhinorrhea, sinus pressure, sneezing, sore throat, trouble swallowing and voice change.   Eyes: Negative for  visual disturbance.  Respiratory: Negative for cough, choking and shortness of breath.   Cardiovascular: Negative for chest pain and leg swelling.  Gastrointestinal: Negative for abdominal pain, diarrhea and vomiting.  Genitourinary: Negative for difficulty urinating.  Musculoskeletal: Negative for arthralgias.  Skin: Negative for rash.  Neurological: Negative for tremors, syncope and headaches.  Hematological: Does not bruise/bleed easily.       Objective:   Physical Exam   Hoarse w/c bound  thin wf nad  Wt Readings from Last 3 Encounters:  10/24/16 92 lb (41.7 kg)  12/28/14 97 lb 6.4 oz (44.2 kg)  04/09/12 104 lb 11.5 oz (47.5 kg)    Vital signs reviewed - Note on arrival 02 sats  94% on RA     HEENT: nl dentition, turbinates, and oropharynx. Nl external ear canals without cough reflex   NECK :  without JVD/Nodes/TM/ nl carotid upstrokes bilaterally   LUNGS: no acc muscle use,  Nl contour chest which is clear to A and P bilaterally without cough on insp or exp maneuvers   CV:  RRR  no s3 or murmur or increase in P2, nad no edema   ABD:  soft and nontender with nl inspiratory excursion in the supine position. No bruits or organomegaly appreciated, bowel sounds nl  MS:   ext warm with classic RA changes both hands, calf tenderness, cyanosis or clubbing No obvious joint restrictions   SKIN: warm and dry without lesions  NEURO:  alert, approp, nl sensorium with  no motor or cerebellar deficits apparent.     I personally reviewed images and agree with radiology impression as follows:  CXR:   09/26/16  Emphysematous hyperinflation of the lungs without acute pulmonary disease. Probable summation of overlapping rib cartilage and pulmonary vessels in the right upper lobe as similar findings are noted in the right lower lobe.  Tapering of both distal clavicles with high-riding humeral heads consistent with rheumatoid arthritis. Marked deformity of the glenohumeral  joints in particular the left humeral head with erosive changes of the proximal humeri would also be in keeping with inflammatory arthritis.     Assessment & Plan:

## 2016-10-24 NOTE — Patient Instructions (Addendum)
Stop lisinopril and start diovan 80 mg one daily instead  Continue zantac 150 mg after supper and at bedtime   GERD (REFLUX)  is an extremely common cause of respiratory symptoms just like yours , many times with no obvious heartburn at all.    It can be treated with medication, but also with lifestyle changes including elevation of the head of your bed (ideally with 6 inch  bed blocks),  Smoking cessation, avoidance of late meals, excessive alcohol, and avoid fatty foods, chocolate, peppermint, colas, red wine, and acidic juices such as orange juice.  NO MINT OR MENTHOL PRODUCTS SO NO COUGH DROPS   USE SUGARLESS CANDY INSTEAD (Jolley ranchers or Stover's or Life Savers) or even ice chips will also do - the key is to swallow to prevent all throat clearing. NO OIL BASED VITAMINS - use powdered substitutes.   Instead of clariton for "allergies"  Try  drainage / throat tickle try take CHLORPHENIRAMINE  4 mg - take one every 4 hours as needed - available over the counter- may cause drowsiness so start with just a bedtime dose or two and see how you tolerate it before trying in daytime     If you are satisfied with your treatment plan,  let your doctor know and he/she can either refill your medications or you can return here when your prescription runs out.     If in any way you are not 100% satisfied,  please tell us.  If 100% better, tell your friends!  Pulmonary follow up is as needed

## 2016-10-25 DIAGNOSIS — R05 Cough: Secondary | ICD-10-CM | POA: Insufficient documentation

## 2016-10-25 DIAGNOSIS — R058 Other specified cough: Secondary | ICD-10-CM | POA: Insufficient documentation

## 2016-10-25 NOTE — Assessment & Plan Note (Addendum)
In the best review of chronic cough to date ( NEJM 2016 375 [phone]) ,  ACEi are now felt to cause cough in up to  20% of pts which is a 4 fold increase from previous reports and does not include the variety of non-specific complaints we see in pulmonary clinic in pts on ACEi but previously attributed to another dx like  Copd/asthma and  include PNDS, throat and chest congestion, "bronchitis", unexplained dyspnea and noct "strangling" sensations, and hoarseness, but also  atypical /refractory GERD symptoms like dysphagia and "bad heartburn"   The only way I know  to prove this is not an "ACEi Case" is a trial off ACEi x a minimum of 6 weeks then regroup. If not  Better  Try diovan 80 mg daily

## 2016-10-25 NOTE — Assessment & Plan Note (Addendum)
The most common causes of chronic cough in immunocompetent adults include the following: upper airway cough syndrome (UACS), previously referred to as postnasal drip syndrome (PNDS), which is caused by variety of rhinosinus conditions; (2) asthma; (3) GERD; (4) chronic bronchitis from cigarette smoking or other inhaled environmental irritants; (5) nonasthmatic eosinophilic bronchitis; and (6) bronchiectasis.   These conditions, singly or in combination, have accounted for up to 94% of the causes of chronic cough in prospective studies.   Other conditions have constituted no >6% of the causes in prospective studies These have included bronchogenic carcinoma, chronic interstitial pneumonia, sarcoidosis, left ventricular failure, ACEI-induced cough, and aspiration from a condition associated with pharyngeal dysfunction.    Chronic cough is often simultaneously caused by more than one condition. A single cause has been found from 38 to 82% of the time, multiple causes from 18 to 62%. Multiply caused cough has been the result of three diseases up to 42% of the time.       This is most likely Upper airway cough syndrome (previously labeled PNDS) , is  so named because it's frequently impossible to sort out how much is  CR/sinusitis with freq throat clearing (which can be related to primary GERD)   vs  causing  secondary (" extra esophageal")  GERD from wide swings in gastric pressure that occur with throat clearing, often  promoting self use of mint and menthol lozenges that reduce the lower esophageal sphincter tone and exacerbate the problem further in a cyclical fashion.   These are the same pts (now being labeled as having "irritable larynx syndrome" by some cough centers) who not infrequently have a history of having failed to tolerate ace inhibitors,  dry powder inhalers or biphosphonates or report having atypical/extraesophageal reflux symptoms that don't respond to standard doses of PPI  and are easily  confused as having aecopd or asthma flares by even experienced allergists/ pulmonologists (myself included).   rec first try off acei and use 1st gen h1 per guidelines if tolerates (rather than clariton) and  max rx for gerd (can't use ppi though) then return p 6 weeks if not improved  Total time devoted to counseling  > 50 % of a 46 m office visit:  review case with pt/ discussion of options/alternatives/ personally creating written customized instructions  in presence of pt  then going over those specific  Instructions directly with the pt including how to use all of the meds but in particular covering each new medication in detail and the difference between the maintenance/automatic meds and the prns using an action plan format for the latter.  Please see AVS from this visit for a full list of these instructions

## 2016-10-25 NOTE — Assessment & Plan Note (Signed)
No evidence of RA lung dz though note also on macodantin which risks ILD  > pt advised

## 2016-10-30 DIAGNOSIS — M0579 Rheumatoid arthritis with rheumatoid factor of multiple sites without organ or systems involvement: Secondary | ICD-10-CM | POA: Diagnosis not present

## 2016-10-30 DIAGNOSIS — M6281 Muscle weakness (generalized): Secondary | ICD-10-CM | POA: Diagnosis not present

## 2016-10-30 DIAGNOSIS — R2689 Other abnormalities of gait and mobility: Secondary | ICD-10-CM | POA: Diagnosis not present

## 2016-10-30 DIAGNOSIS — J410 Simple chronic bronchitis: Secondary | ICD-10-CM | POA: Diagnosis not present

## 2016-10-30 DIAGNOSIS — I10 Essential (primary) hypertension: Secondary | ICD-10-CM | POA: Diagnosis not present

## 2016-11-06 DIAGNOSIS — J410 Simple chronic bronchitis: Secondary | ICD-10-CM | POA: Diagnosis not present

## 2016-11-06 DIAGNOSIS — I10 Essential (primary) hypertension: Secondary | ICD-10-CM | POA: Diagnosis not present

## 2016-11-06 DIAGNOSIS — M6281 Muscle weakness (generalized): Secondary | ICD-10-CM | POA: Diagnosis not present

## 2016-11-06 DIAGNOSIS — R2689 Other abnormalities of gait and mobility: Secondary | ICD-10-CM | POA: Diagnosis not present

## 2016-11-06 DIAGNOSIS — M0579 Rheumatoid arthritis with rheumatoid factor of multiple sites without organ or systems involvement: Secondary | ICD-10-CM | POA: Diagnosis not present

## 2016-11-12 ENCOUNTER — Other Ambulatory Visit: Payer: Self-pay | Admitting: Internal Medicine

## 2016-11-12 NOTE — Telephone Encounter (Signed)
I have sent refill on this and advised pharmacist she would need appt for refills

## 2016-11-12 NOTE — Telephone Encounter (Signed)
MW please advise of refill of the tessalon perles.  This was last filled on 12/28 for #30.  thanks

## 2016-11-13 DIAGNOSIS — M6281 Muscle weakness (generalized): Secondary | ICD-10-CM | POA: Diagnosis not present

## 2016-11-13 DIAGNOSIS — J410 Simple chronic bronchitis: Secondary | ICD-10-CM | POA: Diagnosis not present

## 2016-11-13 DIAGNOSIS — I10 Essential (primary) hypertension: Secondary | ICD-10-CM | POA: Diagnosis not present

## 2016-11-13 DIAGNOSIS — M0579 Rheumatoid arthritis with rheumatoid factor of multiple sites without organ or systems involvement: Secondary | ICD-10-CM | POA: Diagnosis not present

## 2016-11-13 DIAGNOSIS — R2689 Other abnormalities of gait and mobility: Secondary | ICD-10-CM | POA: Diagnosis not present

## 2016-11-18 DIAGNOSIS — Z79899 Other long term (current) drug therapy: Secondary | ICD-10-CM | POA: Diagnosis not present

## 2016-11-18 DIAGNOSIS — Z6821 Body mass index (BMI) 21.0-21.9, adult: Secondary | ICD-10-CM | POA: Diagnosis not present

## 2016-11-18 DIAGNOSIS — M2550 Pain in unspecified joint: Secondary | ICD-10-CM | POA: Diagnosis not present

## 2016-11-18 DIAGNOSIS — M0589 Other rheumatoid arthritis with rheumatoid factor of multiple sites: Secondary | ICD-10-CM | POA: Diagnosis not present

## 2016-11-20 DIAGNOSIS — M6281 Muscle weakness (generalized): Secondary | ICD-10-CM | POA: Diagnosis not present

## 2016-11-20 DIAGNOSIS — J410 Simple chronic bronchitis: Secondary | ICD-10-CM | POA: Diagnosis not present

## 2016-11-20 DIAGNOSIS — R2689 Other abnormalities of gait and mobility: Secondary | ICD-10-CM | POA: Diagnosis not present

## 2016-11-20 DIAGNOSIS — I10 Essential (primary) hypertension: Secondary | ICD-10-CM | POA: Diagnosis not present

## 2016-11-20 DIAGNOSIS — M0579 Rheumatoid arthritis with rheumatoid factor of multiple sites without organ or systems involvement: Secondary | ICD-10-CM | POA: Diagnosis not present

## 2016-11-21 DIAGNOSIS — B3749 Other urogenital candidiasis: Secondary | ICD-10-CM | POA: Diagnosis not present

## 2016-11-21 DIAGNOSIS — R8271 Bacteriuria: Secondary | ICD-10-CM | POA: Diagnosis not present

## 2016-11-21 DIAGNOSIS — R800 Isolated proteinuria: Secondary | ICD-10-CM | POA: Diagnosis not present

## 2016-12-02 DIAGNOSIS — I10 Essential (primary) hypertension: Secondary | ICD-10-CM | POA: Diagnosis not present

## 2016-12-02 DIAGNOSIS — R2689 Other abnormalities of gait and mobility: Secondary | ICD-10-CM | POA: Diagnosis not present

## 2016-12-02 DIAGNOSIS — M6281 Muscle weakness (generalized): Secondary | ICD-10-CM | POA: Diagnosis not present

## 2016-12-02 DIAGNOSIS — J410 Simple chronic bronchitis: Secondary | ICD-10-CM | POA: Diagnosis not present

## 2016-12-02 DIAGNOSIS — M0579 Rheumatoid arthritis with rheumatoid factor of multiple sites without organ or systems involvement: Secondary | ICD-10-CM | POA: Diagnosis not present

## 2016-12-08 ENCOUNTER — Telehealth: Payer: Self-pay | Admitting: Internal Medicine

## 2016-12-08 NOTE — Telephone Encounter (Signed)
MW  Please Advise-   Spoke with daughter and she called and stated her mom is still coughing she stated it is better than it was when you last saw her but it is not 100%.  She states she is coughing up what she describes is a grey color. She has sob mainly after having a really bad coughing spells and with the coughing spells body aches. Denies fever and wheezing. They just wanted to know what else can they do to try to help this cough go away.    2. Tanda Rockers, MD (Physician) at 10/24/2016 3:02 PM - Signed    Stop lisinopril and start diovan 80 mg one daily instead  Continue zantac 150 mg after supper and at bedtime   GERD (REFLUX)  is an extremely common cause of respiratory symptoms just like yours , many times with no obvious heartburn at all.    It can be treated with medication, but also with lifestyle changes including elevation of the head of your bed (ideally with 6 inch  bed blocks),  Smoking cessation, avoidance of late meals, excessive alcohol, and avoid fatty foods, chocolate, peppermint, colas, red wine, and acidic juices such as orange juice.  NO MINT OR MENTHOL PRODUCTS SO NO COUGH DROPS   USE SUGARLESS CANDY INSTEAD (Jolley ranchers or Stover's or Life Savers) or even ice chips will also do - the key is to swallow to prevent all throat clearing. NO OIL BASED VITAMINS - use powdered substitutes.   Instead of clariton for "allergies"  Try  drainage / throat tickle try take CHLORPHENIRAMINE  4 mg - take one every 4 hours as needed - available over the counter- may cause drowsiness so start with just a bedtime dose or two and see how you tolerate it before trying in daytime     If you are satisfied with your treatment plan,  let your doctor know and he/she can either refill your medications or you can return here when your prescription runs out.     If in any way you are not 100% satisfied,  please tell us.  If 100% better, tell your friends!  Pulmonary follow up  is as needed

## 2016-12-08 NOTE — Telephone Encounter (Signed)
Pt scheduled for acute visit with MW on 12-12-16 @ 4:15. Nothing further needed.

## 2016-12-08 NOTE — Telephone Encounter (Signed)
Too many allergies to attempt to treat empirically over the phone > ov with all meds in hand

## 2016-12-12 ENCOUNTER — Encounter: Payer: Self-pay | Admitting: Internal Medicine

## 2016-12-12 ENCOUNTER — Other Ambulatory Visit (INDEPENDENT_AMBULATORY_CARE_PROVIDER_SITE_OTHER): Payer: Medicare Other

## 2016-12-12 ENCOUNTER — Ambulatory Visit (INDEPENDENT_AMBULATORY_CARE_PROVIDER_SITE_OTHER): Payer: Medicare Other | Admitting: Internal Medicine

## 2016-12-12 ENCOUNTER — Ambulatory Visit (INDEPENDENT_AMBULATORY_CARE_PROVIDER_SITE_OTHER)
Admission: RE | Admit: 2016-12-12 | Discharge: 2016-12-12 | Disposition: A | Discharge status: Home / Self Care | Payer: Medicare Other | Source: Ambulatory Visit | Attending: Internal Medicine | Admitting: Internal Medicine

## 2016-12-12 VITALS — BP 132/82 | HR 89 | Ht <= 58 in | Wt 94.8 lb

## 2016-12-12 DIAGNOSIS — R058 Other specified cough: Secondary | ICD-10-CM

## 2016-12-12 DIAGNOSIS — R05 Cough: Secondary | ICD-10-CM | POA: Diagnosis not present

## 2016-12-12 DIAGNOSIS — I10 Essential (primary) hypertension: Secondary | ICD-10-CM

## 2016-12-12 DIAGNOSIS — M069 Rheumatoid arthritis, unspecified: Secondary | ICD-10-CM

## 2016-12-12 LAB — CBC WITH DIFFERENTIAL/PLATELET
BASOS PCT: 0.5 % (ref 0.0–3.0)
Basophils Absolute: 0.1 10*3/uL (ref 0.0–0.1)
EOS PCT: 5.8 % — AB (ref 0.0–5.0)
Eosinophils Absolute: 0.8 10*3/uL — ABNORMAL HIGH (ref 0.0–0.7)
HEMATOCRIT: 34.1 % — AB (ref 36.0–46.0)
HEMOGLOBIN: 11.4 g/dL — AB (ref 12.0–15.0)
LYMPHS PCT: 19.5 % (ref 12.0–46.0)
Lymphs Abs: 2.6 10*3/uL (ref 0.7–4.0)
MCHC: 33.3 g/dL (ref 30.0–36.0)
MCV: 100.5 fl — AB (ref 78.0–100.0)
MONOS PCT: 11.5 % (ref 3.0–12.0)
Monocytes Absolute: 1.5 10*3/uL — ABNORMAL HIGH (ref 0.1–1.0)
Neutro Abs: 8.2 10*3/uL — ABNORMAL HIGH (ref 1.4–7.7)
Neutrophils Relative %: 62.7 % (ref 43.0–77.0)
Platelets: 516 10*3/uL — ABNORMAL HIGH (ref 150.0–400.0)
RBC: 3.4 Mil/uL — ABNORMAL LOW (ref 3.87–5.11)
RDW: 14.8 % (ref 11.5–15.5)
WBC: 13.1 10*3/uL — ABNORMAL HIGH (ref 4.0–10.5)

## 2016-12-12 LAB — SEDIMENTATION RATE: Sed Rate: 28 mm/hr (ref 0–30)

## 2016-12-12 NOTE — Progress Notes (Signed)
Subjective:    Patient ID: [name], female    DOB: [birth year],   MRN: [id number]    Brief patient profile:  85 yowf minimal smoking hx and dx RA since 1985 followed  by Trudie Reed referred to pulmonary clinic 10/24/2016 by Dr   Felipa Eth for cough - lives with daughter     History of Present Illness  10/24/2016 1st [place] [facility] office visit/ Alastor Kneale   On macrodantin and acei  Chief Complaint  Patient presents with  . Pulmonary Consult    first started with a tickle in throat  a year ago, cough but unable to bring any mucus up  from [place] and moved to [place] around 2010 and developed "allergy symtpoms" around 1-2 years prior to OV  Then tickle in throat progressed down in chest  And feels there's mucus always there but can't bring it up, does not however disturb sleep or exac in ams on awakening and no assoc nasal disharge/itching or sneezing- no better on pred for RA   zantac for gerd as can't take ppi . / no better with  clariton  - some better with tessalon  rec Stop lisinopril and start diovan 80 mg one daily instead Continue zantac 150 mg after supper and at bedtime  GERD diet  Instead of clariton for "allergies"  Try  drainage / throat tickle try take CHLORPHENIRAMINE  4 mg - take one every 4 hours as needed - available over the counter- may cause drowsiness so start with just a bedtime dose or two and see how you tolerate it before trying in daytime      12/12/2016  f/u ov/Doraine Schexnider re:  uacs  Chief Complaint  Patient presents with  . Acute Visit    Pt. here today states her cough has been better, but she it is still there, Has noticed she has some thick mucus when she coughs Denies chest tightness   cough most bothersome between  4-6 pm , then around 9 pm but not  noct at all or flaring in ams No longer has sensation of globus but still some tickle and did not change clairiton to chlorpheniramine as rec  No obvious day to day or daytime variability or assoc  purulent sputum  or mucus plugs or hemoptysis or cp or chest tightness, subjective wheeze or overt sinus or hb symptoms. No unusual exp hx or h/o childhood pna/ asthma or knowledge of premature birth.  Sleeping ok without nocturnal  or early am exacerbation  of respiratory  c/o's or need for noct saba. Also denies any obvious fluctuation of symptoms with weather or environmental changes or other aggravating or alleviating factors except as outlined above   Current Medications, Allergies, Complete Past Medical History, Past Surgical History, Family History, and Social History were reviewed in [employer] record.  ROS  The following are not active complaints unless bolded sore throat, dysphagia, dental problems, itching, sneezing,  nasal congestion or excess/ purulent secretions, ear ache,   fever, chills, sweats, unintended wt loss, classically pleuritic or exertional cp,  orthopnea pnd or leg swelling, presyncope, palpitations, abdominal pain, anorexia, nausea, vomiting, diarrhea  or change in bowel or bladder habits, change in stools or urine, dysuria,hematuria,  rash, arthralgias better than baseline overall, visual complaints, headache, numbness, weakness or ataxia or problems with walking or coordination= uses tricycle walker,  change in mood/affect or memory.  Objective:   Physical Exam    w/c bound  thin wf nad using  tricylce walker  - still some hoarseness, min spont coughing   12/12/2016         94   10/24/16 92 lb (41.7 kg)  12/28/14 97 lb 6.4 oz (44.2 kg)  04/09/12 104 lb 11.5 oz (47.5 kg)    Vital signs reviewed - Note on arrival 02 sats  96% on RA     HEENT: nl dentition, turbinates, and oropharynx with minimal watery pnd/no cobblestoning. Nl external ear canals without cough reflex   NECK :  without JVD/Nodes/TM/ nl carotid upstrokes bilaterally   LUNGS: no acc muscle use,  Nl contour chest which is clear to A and P bilaterally without cough on insp  or exp maneuvers   CV:  RRR  no s3 or murmur or increase in P2, nad no edema   ABD:  soft and nontender with nl inspiratory excursion in the supine position. No bruits or organomegaly appreciated, bowel sounds nl  MS:   ext warm with classic severe RA changes both hands, calf tenderness, cyanosis or clubbing No obvious joint restrictions   SKIN: warm and dry without lesions    NEURO:  alert, approp, nl sensorium with  no motor or cerebellar deficits apparent.    CXR PA and Lateral:   12/12/2016 :    I personally reviewed images and agree with radiology impression as follows:   Normal cardiac silhouette. Lungs are hyperinflated. No effusion, infiltrate or pneumothorax. Severe degenerate changes of the Shoulders.   Labs ordered 12/12/2016  Allergy profile    Lab Results  Component Value Date   ESRSEDRATE 28 12/12/2016         Assessment & Plan:

## 2016-12-12 NOTE — Patient Instructions (Addendum)
Prednisone 20 mg x 5 days then 10 x 5 days and 5 mg daily x 5 days to see what happens with your cough   Stop clariton for now and For drainage / throat tickle try take CHLORPHENIRAMINE  4 mg - take one every 4 hours as needed - available over the counter- may cause drowsiness so start with just a bedtime dose or two and see how you tolerate it before trying in daytime    Please see patient coordinator before you leave today  to schedule sinus CT  Please remember to go to the lab and x-ray department downstairs in the basement  for your tests - we will call you with the results when they are available.    Please schedule a follow up office visit in 4 weeks, sooner if needed

## 2016-12-13 NOTE — Assessment & Plan Note (Signed)
D/c acei 10/24/2016 due to cough > improved 12/12/2016   Although even in retrospect it may not be clear the ACEi contributed to the pt's symptoms,  Pt improved off them and adding them back at this point or in the future would risk confusion in interpretation of non-specific respiratory symptoms to which this patient is prone  ie  Better not to muddy the waters here.   Continue diovan 80 mg daily / Follow up per Primary Care planned

## 2016-12-13 NOTE — Assessment & Plan Note (Addendum)
Trial off acei 10/24/2016 > some better 12/12/2016  - Allergy profile 12/12/2016 >  Eos 0.8 /  IgE   - Sinus CT 12/13/2016 >>>   Clearly better but as in most cases of chronic cough there is more than one cause so rec short course of prednisone to cover allergy/asthma mech while waiting for allergy profile and sinus ct and continuing off acei (see separate a/p) - still concerned about macrodantin but little evidence to support it and even ESR despite RA is wnl   I had an extended discussion with the patient reviewing all relevant studies completed to date and  lasting 15 to 20 minutes of a 25 minute visit    Each maintenance medication was reviewed in detail including most importantly the difference between maintenance and prns and under what circumstances the prns are to be triggered using an action plan format that is not reflected in the computer generated alphabetically organized AVS.    Please see AVS for specific instructions unique to this visit that I personally wrote and verbalized to the the pt in detail and then reviewed with pt  by my nurse highlighting any  changes in therapy recommended at today's visit to their plan of care.

## 2016-12-13 NOTE — Assessment & Plan Note (Signed)
No evidence of RA lung dz though bronchiolitis remains in ddx. Already has pred to use prn flares of arthritis so will use 20 mg and taper off x 15 days

## 2016-12-15 ENCOUNTER — Telehealth: Payer: Self-pay | Admitting: Internal Medicine

## 2016-12-15 LAB — RESPIRATORY ALLERGY PROFILE REGION II ~~LOC~~
Allergen, C. Herbarum, M2: <0.1 kU/L
Allergen, Cottonwood, t14: <0.1 kU/L
Allergen, D pternoyssinus,d7: 0.22 kU/L — ABNORMAL HIGH
Allergen, Mulberry, t76: <0.1 kU/L
Allergen, P. notatum, m1: <0.1 kU/L
Aspergillus fumigatus, m3: <0.1 kU/L
Bermuda Grass: <0.1 kU/L
Cockroach: <0.1 kU/L
Common Ragweed: <0.1 kU/L
D. farinae: 0.24 kU/L — ABNORMAL HIGH
Dog Dander: <0.1 kU/L
Elm IgE: <0.1 kU/L
IGE (IMMUNOGLOBULIN E), SERUM: 20 kU/L (ref <115)
Johnson Grass: <0.1 kU/L
Pecan/Hickory Tree IgE: <0.1 kU/L
Timothy Grass: <0.1 kU/L

## 2016-12-15 MED ORDER — PREDNISONE 10 MG PO TABS: ORAL_TABLET | ORAL | 0 refills | Status: DC

## 2016-12-15 NOTE — Progress Notes (Signed)
Spoke with pt and notified of results per Dr. Wert. Pt verbalized understanding and denied any questions. 

## 2016-12-15 NOTE — Telephone Encounter (Signed)
Called and spoke to pt's daughter, Hassan Rowan. Hassan Rowan states when the pt was seen by MW on 12/12/16 the pred was not sent to pharmacy. Rx sent to preferred pharmacy. Hassan Rowan verbalized understanding and denied any further questions or concerns at this time.

## 2016-12-16 NOTE — Progress Notes (Signed)
Spoke with pt's daughter and notified of results per Dr. Wert. She verbalized understanding and denied any questions. 

## 2016-12-18 ENCOUNTER — Ambulatory Visit (INDEPENDENT_AMBULATORY_CARE_PROVIDER_SITE_OTHER)
Admission: RE | Admit: 2016-12-18 | Discharge: 2016-12-18 | Disposition: A | Discharge status: Home / Self Care | Payer: Medicare Other | Source: Ambulatory Visit | Attending: Internal Medicine | Admitting: Internal Medicine

## 2016-12-18 DIAGNOSIS — J0100 Acute maxillary sinusitis, unspecified: Secondary | ICD-10-CM | POA: Diagnosis not present

## 2016-12-18 DIAGNOSIS — R05 Cough: Secondary | ICD-10-CM

## 2016-12-18 DIAGNOSIS — R058 Other specified cough: Secondary | ICD-10-CM

## 2016-12-18 NOTE — Progress Notes (Signed)
Spoke with pt and notified of results per Dr. Wert. Pt verbalized understanding and denied any questions. 

## 2017-01-01 ENCOUNTER — Ambulatory Visit (INDEPENDENT_AMBULATORY_CARE_PROVIDER_SITE_OTHER): Payer: Medicare Other

## 2017-01-01 ENCOUNTER — Telehealth: Payer: Self-pay | Admitting: Internal Medicine

## 2017-01-01 ENCOUNTER — Ambulatory Visit (INDEPENDENT_AMBULATORY_CARE_PROVIDER_SITE_OTHER): Payer: Self-pay

## 2017-01-01 ENCOUNTER — Encounter (INDEPENDENT_AMBULATORY_CARE_PROVIDER_SITE_OTHER): Payer: Self-pay | Admitting: Orthopedic Surgery

## 2017-01-01 ENCOUNTER — Ambulatory Visit (INDEPENDENT_AMBULATORY_CARE_PROVIDER_SITE_OTHER): Payer: Medicare Other | Admitting: Orthopedic Surgery

## 2017-01-01 VITALS — Ht <= 58 in | Wt 94.0 lb

## 2017-01-01 DIAGNOSIS — M79672 Pain in left foot: Secondary | ICD-10-CM | POA: Insufficient documentation

## 2017-01-01 DIAGNOSIS — L97521 Non-pressure chronic ulcer of other part of left foot limited to breakdown of skin: Secondary | ICD-10-CM | POA: Diagnosis not present

## 2017-01-01 DIAGNOSIS — M79671 Pain in right foot: Secondary | ICD-10-CM

## 2017-01-01 DIAGNOSIS — B351 Tinea unguium: Secondary | ICD-10-CM | POA: Diagnosis not present

## 2017-01-01 NOTE — Telephone Encounter (Signed)
Spoke with pt's daughter Hassan Rowan (dpr on file), requesting ct sinus results to be sent to pt's ENT- Dr. Marijo Conception at [facility].  This has been sent.  Nothing further needed.

## 2017-01-01 NOTE — Progress Notes (Signed)
Office Visit Note   Patient: [name]           Date of Birth: 1931/04/03           MRN: [id number] Visit Date: 01/01/2017              Requested by: Lajean Manes, MD [address] [place], [place] [address] PCP: Mathews Argyle, MD  No chief complaint on file.   HPI: Patient is here today for bilateral foot eval. The right foot the second toe is completely underneath the great toe and there is a red spot on the tip of this toe. The left foot the bony protrusion on the left great toe and all other toes are crossing over the others and this is causing a lot of pain per pt. She is weight bearing with a rolling wheelchair and sandals. Pamella Pert, RMA    Assessment & Plan: Visit Diagnoses:  1. Pain in right foot   2. Pain in left foot   3. Onychomycosis   4. Ulcer of toe of left foot, limited to breakdown of skin ([place])     Plan: Patient states that her feet felt better after the nails were trimmed and the ulcer debrided of skin and soft tissue left great toe. We will plan to follow-up with her in 3 months. No indication for surgery at this time and with patient's peripheral vascular disease she would have a difficult time healing any type of forefoot surgery.  Follow-Up Instructions: Return in about 3 months (around 03/31/2017).   Ortho Exam On examination patient is alert oriented no adenopathy well-dressed normal affect normal respiratory effort she ambulates with a rolling walker. Examination she has palpable pulses bilaterally. There is no cellulitis on either foot. She has severe deformity of her toes with overlapping of all of the toes. There is no signs of ulcers at the overlapping toes she does have a ulcerative lesion beneath the first metatarsal head of left foot. After informed consent a 10 blade knife was used to debride the skin and soft tissue back to healthy viable tissue the area was 10 mm in diameter and 1 mm deep. After debridement  patient states that her pain was significant resolve and she was able to ambulate better. She had thick and discolored onychomycotic nails 10 she is unable safely trim the nails around in the nails were trimmed 10 without convocation.  Imaging: Xr Foot 2 Views Left  Result Date: 01/01/2017 Two-view radiographs of left foot shows significant peripheral vascular disease with calcification of the vessels extending out to the toes. She has severe deformity from her rheumatoid arthritis with dislocation of all of the joints at the MTP joint with multiple areas of bony resection from previous surgeries.  Xr Foot 2 Views Right  Result Date: 01/01/2017 Two-view radiographs of the right foot shows calcification of the digital vessels to the toes with severe peripheral vascular disease. There is fusion of the great toe MTP joint there is multiple dislocations and fusions from previous surgeries. No signs of osteomyelitis.   Orders:  Orders Placed This Encounter  Procedures  . XR Foot 2 Views Right  . XR Foot 2 Views Left   No orders of the defined types were placed in this encounter.    Procedures: No procedures performed  Clinical Data: No additional findings.  Subjective: Review of Systems  Objective: Vital Signs: Ht 4\' 6"  (1.372 m)   Wt 94 lb (42.6 kg)  BMI 22.66 kg/m   Specialty Comments:  No specialty comments available.  PMFS History: Patient Active Problem List   Diagnosis Date Noted  . Pain in right foot 01/01/2017  . Pain in left foot 01/01/2017  . Onychomycosis 01/01/2017  . Ulcer of toe of left foot, limited to breakdown of skin ([place]) 01/01/2017  . Upper airway cough syndrome 10/25/2016  . Leukocytosis 04/09/2012  . Pubic ramus fracture ([place]) 04/09/2012  . Hyponatremia 04/09/2012  . Essential hypertension 04/09/2012  . Rheumatoid arthritis ([place]) 04/09/2012   Past Medical History:  Diagnosis Date  . Blood transfusion    hx of with out reaction  . Cancer  [facility])    Skin cancer; removed by dermatologist  . 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 GERD (gastroesophageal reflux disease)   . History of urinary frequency    and urgency after surgery  . Hypertension   . Interstitial cystitis   . Irritable bowel syndrome    osteoporosis  . Recurrent upper respiratory infection (URI)   . Rheumatoid arthritis(714.0)   . Seasonal allergies   . Seizures ([place])     Family History  Problem Relation Age of Onset  . Dementia Mother   . Alzheimer's disease Mother   . Colon cancer Father   . Breast cancer Sister   . Anesthesia problems Neg Hx   . Hypotension Neg Hx   . Malignant hyperthermia Neg Hx   . Pseudochol deficiency Neg Hx     Past Surgical History:  Procedure Laterality Date  . ABDOMINAL HYSTERECTOMY  1974  . APPENDECTOMY    . BREAST SURGERY     Lump removed from right breast in 1989  . CARDIAC CATHETERIZATION     In 2000; clean cath  . CATARACT EXTRACTION, BILATERAL    . COLONOSCOPY     several colonoscopies  . FOOT SURGERY     Bilateral feet  . HIP ARTHROPLASTY     Right   . HIP PINNING     left upper thigh  . 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 TONGUE SURGERY     Multiple tongue surgeries   Social History   Occupational History  . Not on file.   Social History Main Topics  . Smoking status: Former Smoker    Packs/day: 0.00    Types: Cigarettes  . Smokeless tobacco: Never Used  . Alcohol use No  . Drug use: No  . Sexual activity: No

## 2017-01-05 DIAGNOSIS — Z85818 Personal history of malignant neoplasm of other sites of lip, oral cavity, and pharynx: Secondary | ICD-10-CM | POA: Diagnosis not present

## 2017-01-05 DIAGNOSIS — C44329 Squamous cell carcinoma of skin of other parts of face: Secondary | ICD-10-CM | POA: Diagnosis not present

## 2017-01-05 DIAGNOSIS — C060 Malignant neoplasm of cheek mucosa: Secondary | ICD-10-CM | POA: Diagnosis not present

## 2017-01-05 DIAGNOSIS — K1379 Other lesions of oral mucosa: Secondary | ICD-10-CM | POA: Diagnosis not present

## 2017-01-09 ENCOUNTER — Encounter: Payer: Self-pay | Admitting: Internal Medicine

## 2017-01-09 ENCOUNTER — Ambulatory Visit (INDEPENDENT_AMBULATORY_CARE_PROVIDER_SITE_OTHER): Payer: Medicare Other | Admitting: Internal Medicine

## 2017-01-09 VITALS — BP 104/62 | HR 87 | Ht <= 58 in | Wt 90.6 lb

## 2017-01-09 DIAGNOSIS — R05 Cough: Secondary | ICD-10-CM | POA: Diagnosis not present

## 2017-01-09 DIAGNOSIS — I10 Essential (primary) hypertension: Secondary | ICD-10-CM | POA: Diagnosis not present

## 2017-01-09 DIAGNOSIS — R058 Other specified cough: Secondary | ICD-10-CM

## 2017-01-09 NOTE — Patient Instructions (Addendum)
There is a risk of pulmonary complication from taking macrobid that usually causes cough and shortness of breath so if gets worse on your present meds you will need to stop the macrobid and return here asap  If you are satisfied with your treatment plan,  let your doctor know and he/she can either refill your medications or you can return here when your prescription runs out.     If in any way you are not 100% satisfied,  please tell us.  If 100% better, tell your friends!  Pulmonary follow up is as needed

## 2017-01-09 NOTE — Progress Notes (Signed)
Subjective:    Patient ID: [name], female    DOB: [birth year],   MRN: [id number]    Brief patient profile:  86 yowf minimal smoking hx and dx RA since 1985 followed  by Trudie Reed referred to pulmonary clinic 10/24/2016 by Dr   Felipa Eth for cough - lives with daughter     History of Present Illness  10/24/2016 1st [place] [facility] office visit/ [name]   On macrodantin and acei  Chief Complaint  Patient presents with  . Pulmonary Consult    first started with a tickle in throat  a year ago, cough but unable to bring any mucus up  from [place] and moved to [place] around 2010 and developed "allergy symtpoms" around 1-2 years prior to OV  Then tickle in throat progressed down in chest  And feels there's mucus always there but can't bring it up, does not however disturb sleep or exac in ams on awakening and no assoc nasal disharge/itching or sneezing- no better on pred for RA   zantac for gerd as can't take ppi . / no better with  clariton  - some better with tessalon  rec Stop lisinopril and start diovan 80 mg one daily instead Continue zantac 150 mg after supper and at bedtime  GERD diet  Instead of clariton for "allergies"  Try  drainage / throat tickle try take CHLORPHENIRAMINE  4 mg - take one every 4 hours as needed - available over the counter- may cause drowsiness so start with just a bedtime dose or two and see how you tolerate it before trying in daytime      12/12/2016  f/u ov/[name] re:  uacs  Chief Complaint  Patient presents with  . Acute Visit    Pt. here today states her cough has been better, but she it is still there, Has noticed she has some thick mucus when she coughs Denies chest tightness   cough most bothersome between  4-6 pm , then around 9 pm but not  noct at all or flaring in ams No longer has sensation of globus but still some tickle and did not change clairiton to chlorpheniramine as rec rec Prednisone 20 mg x 5 days then 10 x 5 days and 5 mg daily x 5 days  to see what happens with your cough  Stop clariton for now and For drainage / throat tickle try take CHLORPHENIRAMINE  4 mg - take one every 4 hours as needed - available over the counter- may cause drowsiness so start with just a bedtime dose or two and see how you tolerate it before trying in daytime  Please see patient coordinator before you leave today  to schedule sinus CT> neg       01/09/2017  f/u ov/[name] re: chronic cough/ on macrobid/  On zantac bid and  1st gen H1 prn  Chief Complaint  Patient presents with  . Follow-up    Coughing less but more prod with brown, thick sputum.    cough not keeping her up, mostly just has it in afternoon and early evening and the actual amt of mucus is min  Not limited by breathing from desired activities        No obvious day to day or daytime variability or assoc   mucus plugs or hemoptysis or cp or chest tightness, subjective wheeze or overt sinus or hb symptoms. No unusual exp hx or h/o childhood pna/ asthma or knowledge of premature birth.  Sleeping ok  without nocturnal  or early am exacerbation  of respiratory  c/o's or need for noct saba. Also denies any obvious fluctuation of symptoms with weather or environmental changes or other aggravating or alleviating factors except as outlined above   Current Medications, Allergies, Complete Past Medical History, Past Surgical History, Family History, and Social History were reviewed in [employer] record.  ROS  The following are not active complaints unless bolded sore throat, dysphagia, dental problems, itching, sneezing,  nasal congestion or excess/ purulent secretions, ear ache,   fever, chills, sweats, unintended wt loss, classically pleuritic or exertional cp,  orthopnea pnd or leg swelling, presyncope, palpitations, abdominal pain, anorexia, nausea, vomiting, diarrhea  or change in bowel or bladder habits, change in stools or urine, dysuria,hematuria,  rash, arthralgias better  than baseline overall, visual complaints, headache, numbness, weakness or ataxia or problems with walking or coordination= uses tricycle walker,  change in mood/affect or memory.                 Objective:   Physical Exam     thin wf nad using  tricylce walker - all smiles today   01/09/2017         90 12/12/2016         94   10/24/16 92 lb (41.7 kg)  12/28/14 97 lb 6.4 oz (44.2 kg)  04/09/12 104 lb 11.5 oz (47.5 kg)    Vital signs reviewed - Note on arrival 02 sats  98% on RA     HEENT: nl dentition, turbinates, and oropharynx with minimal watery pnd/no cobblestoning. Nl external ear canals without cough reflex   NECK :  without JVD/Nodes/TM/ nl carotid upstrokes bilaterally   LUNGS: no acc muscle use,  Nl contour chest which is clear to A and P bilaterally without cough on insp or exp maneuvers   CV:  RRR  no s3 or murmur or increase in P2, nad no edema   ABD:  soft and nontender with nl inspiratory excursion in the supine position. No bruits or organomegaly appreciated, bowel sounds nl  MS:   ext warm with classic severe RA changes both hands, calf tenderness, cyanosis or clubbing No obvious joint restrictions   SKIN: warm and dry without lesions    NEURO:  alert, approp, nl sensorium with  no motor or cerebellar deficits apparent.   CXR PA and Lateral:   12/12/2016 :    I personally reviewed images and agree with radiology impression as follows:   Normal cardiac silhouette. Lungs are hyperinflated. No effusion, infiltrate or pneumothorax. Severe degenerate changes of the Shoulders.      Lab Results  Component Value Date   ESRSEDRATE 28 12/12/2016     Sinus CT 12/18/16 > neg     Assessment & Plan:   Outpatient Encounter Prescriptions as of 01/09/2017  Medication Sig  . adalimumab (HUMIRA) 40 MG/0.8ML injection Inject 40 mg into the skin once a week.  Marland Kitchen amLODipine (NORVASC) 10 MG tablet Take 10 mg by mouth at bedtime.   Marland Kitchen aspirin EC 81 MG tablet Take 162 mg by  mouth daily as needed. For pain  . azaTHIOprine (IMURAN) 50 MG tablet Take 25 mg by mouth daily.  . benzonatate (TESSALON) 100 MG capsule take 1 capsule by mouth every 6 hours if needed cough  . calcitonin, salmon, (MIACALCIN/FORTICAL) 200 UNIT/ACT nasal spray Place 1 spray into the nose daily.  . chlorpheniramine (CHLOR-TRIMETON) 4 MG tablet Take 4 mg by mouth every  4 (four) hours as needed for allergies.  . Cholecalciferol (D3-1000) 1000 UNITS capsule Take 1,000 Units by mouth at bedtime.   Marland Kitchen DIGESTIVE ENZYMES PO Take 1 capsule by mouth 2 (two) times daily.  Marland Kitchen FLUTICASONE PROPIONATE EX Apply 1 application topically daily as needed (skin rash). For inflamation   . Glucosamine-Chondroit-Vit C-Mn (GLUCOSAMINE-CHONDROITIN) CAPS Take 1 capsule by mouth 3 (three) times daily.   . Glutamic Acid HCl (L-GLUTAMIC ACID) 500 MG TABS Take 1 tablet by mouth daily.  . L-Glutamine 500 MG CAPS Take 1 capsule by mouth 3 (three) times daily.  . LevOCARNitine (L-CARNITINE) 250 MG CAPS Take 1 capsule by mouth daily.  . LevOCARNitine L-Tartrate (L-CARNITINE) 500 MG CAPS Take 1 capsule by mouth daily.  Marland Kitchen loratadine (CLARITIN) 10 MG tablet Take 10 mg by mouth at bedtime.  . Misc Natural Products (PUMPKIN SEED OIL) CAPS Take 1 capsule by mouth 4 (four) times daily.  . Multiple Vitamin (MULITIVITAMIN WITH MINERALS) TABS Take 1 tablet by mouth 3 (three) times daily.  . nitrofurantoin, macrocrystal-monohydrate, (MACROBID) 100 MG capsule Take 100 mg by mouth at bedtime.  . Nutritional Supplements (WELLNESS ESSENTIALS PO) Take 1 capsule by mouth 3 (three) times daily.  Marland Kitchen OVER THE COUNTER MEDICATION Take 1 capsule by mouth 2 (two) times daily. Ace's vitamin A-C-E with Selenium  . OVER THE COUNTER MEDICATION Take 1-2 tablets by mouth 3 (three) times daily. Take 2 tablets in AM, 1 tablet at [facility], and 2 tablets in PM  Calcium-Magnesium-Vitamin D supplement  . OVER THE COUNTER MEDICATION Take 2 capsules by mouth 4 (four) times  daily as needed. For acid reflux   Mega-zyme digestive enzyme  . OVER THE COUNTER MEDICATION Take 1 tablet by mouth daily as needed. For restless leg syndrom   Magnesium malate  . PHENObarbital (LUMINAL) 60 MG tablet Take 60 mg by mouth 2 (two) times daily.  . Probiotic Product (PROBIOTIC PO) Take 1 capsule by mouth 2 (two) times daily.  . ranitidine (ZANTAC) 150 MG tablet Take 150 mg by mouth 2 (two) times daily.  . valsartan (DIOVAN) 80 MG tablet Take 1 tablet (80 mg total) by mouth daily.  . predniSONE (DELTASONE) 5 MG tablet Take 5 mg by mouth daily as needed. For rheumatoid flare-ups  . [DISCONTINUED] predniSONE (DELTASONE) 10 MG tablet Take 20mg  x 5 days then 10mg  x 5 days and 5mg  daily x 5 days.   No facility-administered encounter medications on file as of 01/09/2017.

## 2017-01-11 NOTE — Assessment & Plan Note (Signed)
Trial off acei 10/24/2016 > some better 12/12/2016  - Allergy profile 12/12/2016 >  Eos 0.8 /  IgE 20 neg RAST - Sinus CT 12/18/2016 >  No evidence of sinusitis    Minimally productive chronic cough is quite a bet better with rx for UACS off acei and on GERD rx and does not appear to have any serious underlying problem though we haven't excluded bronchiectasis here and if she has a pattern of recurrent pulmonary infections that would be the next step  Cautioned today re use of macrobid in pt with chronic cough but she is aware of the risk of masking macrobid pulmonary toxicity / overlap with UACS  Pulmonary f/u can be prn  I had an extended discussion with the patient and daughter reviewing all relevant studies completed to date and  lasting 15 to 20 minutes of a 25 minute visit    Each maintenance medication was reviewed in detail including most importantly the difference between maintenance and prns and under what circumstances the prns are to be triggered using an action plan format that is not reflected in the computer generated alphabetically organized AVS.    Please see AVS for specific instructions unique to this visit that I personally wrote and verbalized to the the pt in detail and then reviewed with pt  by my nurse highlighting any  changes in therapy recommended at today's visit to their plan of care.

## 2017-01-11 NOTE — Assessment & Plan Note (Signed)
Adequate control on present rx, reviewed in detail with pt > no change in rx needed  > Follow up per Primary Care planned   

## 2017-01-15 DIAGNOSIS — C028 Malignant neoplasm of overlapping sites of tongue: Secondary | ICD-10-CM | POA: Diagnosis not present

## 2017-01-15 DIAGNOSIS — M6281 Muscle weakness (generalized): Secondary | ICD-10-CM | POA: Diagnosis not present

## 2017-01-15 DIAGNOSIS — I10 Essential (primary) hypertension: Secondary | ICD-10-CM | POA: Diagnosis not present

## 2017-01-15 DIAGNOSIS — G609 Hereditary and idiopathic neuropathy, unspecified: Secondary | ICD-10-CM | POA: Diagnosis not present

## 2017-01-15 DIAGNOSIS — M0579 Rheumatoid arthritis with rheumatoid factor of multiple sites without organ or systems involvement: Secondary | ICD-10-CM | POA: Diagnosis not present

## 2017-01-15 DIAGNOSIS — L309 Dermatitis, unspecified: Secondary | ICD-10-CM | POA: Diagnosis not present

## 2017-01-15 DIAGNOSIS — K589 Irritable bowel syndrome without diarrhea: Secondary | ICD-10-CM | POA: Diagnosis not present

## 2017-01-15 DIAGNOSIS — G40909 Epilepsy, unspecified, not intractable, without status epilepticus: Secondary | ICD-10-CM | POA: Diagnosis not present

## 2017-01-15 DIAGNOSIS — K219 Gastro-esophageal reflux disease without esophagitis: Secondary | ICD-10-CM | POA: Diagnosis not present

## 2017-01-15 DIAGNOSIS — N390 Urinary tract infection, site not specified: Secondary | ICD-10-CM | POA: Diagnosis not present

## 2017-01-15 DIAGNOSIS — M0609 Rheumatoid arthritis without rheumatoid factor, multiple sites: Secondary | ICD-10-CM | POA: Diagnosis not present

## 2017-01-15 DIAGNOSIS — N3010 Interstitial cystitis (chronic) without hematuria: Secondary | ICD-10-CM | POA: Diagnosis not present

## 2017-01-15 DIAGNOSIS — M059 Rheumatoid arthritis with rheumatoid factor, unspecified: Secondary | ICD-10-CM | POA: Diagnosis not present

## 2017-01-15 DIAGNOSIS — S3282XA Multiple fractures of pelvis without disruption of pelvic ring, initial encounter for closed fracture: Secondary | ICD-10-CM | POA: Diagnosis not present

## 2017-01-15 DIAGNOSIS — R630 Anorexia: Secondary | ICD-10-CM | POA: Diagnosis not present

## 2017-01-15 DIAGNOSIS — G4089 Other seizures: Secondary | ICD-10-CM | POA: Diagnosis not present

## 2017-01-15 DIAGNOSIS — J301 Allergic rhinitis due to pollen: Secondary | ICD-10-CM | POA: Diagnosis not present

## 2017-01-15 DIAGNOSIS — C069 Malignant neoplasm of mouth, unspecified: Secondary | ICD-10-CM | POA: Diagnosis not present

## 2017-01-16 DIAGNOSIS — C028 Malignant neoplasm of overlapping sites of tongue: Secondary | ICD-10-CM | POA: Diagnosis not present

## 2017-01-16 DIAGNOSIS — I10 Essential (primary) hypertension: Secondary | ICD-10-CM | POA: Diagnosis not present

## 2017-01-16 DIAGNOSIS — N390 Urinary tract infection, site not specified: Secondary | ICD-10-CM | POA: Diagnosis not present

## 2017-01-16 DIAGNOSIS — N3010 Interstitial cystitis (chronic) without hematuria: Secondary | ICD-10-CM | POA: Diagnosis not present

## 2017-01-16 DIAGNOSIS — R630 Anorexia: Secondary | ICD-10-CM | POA: Diagnosis not present

## 2017-01-16 DIAGNOSIS — M059 Rheumatoid arthritis with rheumatoid factor, unspecified: Secondary | ICD-10-CM | POA: Diagnosis not present

## 2017-01-20 DIAGNOSIS — I10 Essential (primary) hypertension: Secondary | ICD-10-CM | POA: Diagnosis not present

## 2017-01-20 DIAGNOSIS — M059 Rheumatoid arthritis with rheumatoid factor, unspecified: Secondary | ICD-10-CM | POA: Diagnosis not present

## 2017-01-20 DIAGNOSIS — N390 Urinary tract infection, site not specified: Secondary | ICD-10-CM | POA: Diagnosis not present

## 2017-01-20 DIAGNOSIS — N3010 Interstitial cystitis (chronic) without hematuria: Secondary | ICD-10-CM | POA: Diagnosis not present

## 2017-01-20 DIAGNOSIS — C028 Malignant neoplasm of overlapping sites of tongue: Secondary | ICD-10-CM | POA: Diagnosis not present

## 2017-01-20 DIAGNOSIS — R630 Anorexia: Secondary | ICD-10-CM | POA: Diagnosis not present

## 2017-01-21 DIAGNOSIS — C028 Malignant neoplasm of overlapping sites of tongue: Secondary | ICD-10-CM | POA: Diagnosis not present

## 2017-01-21 DIAGNOSIS — N390 Urinary tract infection, site not specified: Secondary | ICD-10-CM | POA: Diagnosis not present

## 2017-01-21 DIAGNOSIS — N3010 Interstitial cystitis (chronic) without hematuria: Secondary | ICD-10-CM | POA: Diagnosis not present

## 2017-01-21 DIAGNOSIS — R630 Anorexia: Secondary | ICD-10-CM | POA: Diagnosis not present

## 2017-01-21 DIAGNOSIS — M059 Rheumatoid arthritis with rheumatoid factor, unspecified: Secondary | ICD-10-CM | POA: Diagnosis not present

## 2017-01-21 DIAGNOSIS — I10 Essential (primary) hypertension: Secondary | ICD-10-CM | POA: Diagnosis not present

## 2017-01-23 DIAGNOSIS — R630 Anorexia: Secondary | ICD-10-CM | POA: Diagnosis not present

## 2017-01-23 DIAGNOSIS — C028 Malignant neoplasm of overlapping sites of tongue: Secondary | ICD-10-CM | POA: Diagnosis not present

## 2017-01-23 DIAGNOSIS — M059 Rheumatoid arthritis with rheumatoid factor, unspecified: Secondary | ICD-10-CM | POA: Diagnosis not present

## 2017-01-23 DIAGNOSIS — N390 Urinary tract infection, site not specified: Secondary | ICD-10-CM | POA: Diagnosis not present

## 2017-01-23 DIAGNOSIS — N3010 Interstitial cystitis (chronic) without hematuria: Secondary | ICD-10-CM | POA: Diagnosis not present

## 2017-01-23 DIAGNOSIS — I10 Essential (primary) hypertension: Secondary | ICD-10-CM | POA: Diagnosis not present

## 2017-01-26 DIAGNOSIS — R630 Anorexia: Secondary | ICD-10-CM | POA: Diagnosis not present

## 2017-01-26 DIAGNOSIS — N3010 Interstitial cystitis (chronic) without hematuria: Secondary | ICD-10-CM | POA: Diagnosis not present

## 2017-01-26 DIAGNOSIS — N390 Urinary tract infection, site not specified: Secondary | ICD-10-CM | POA: Diagnosis not present

## 2017-01-26 DIAGNOSIS — C028 Malignant neoplasm of overlapping sites of tongue: Secondary | ICD-10-CM | POA: Diagnosis not present

## 2017-01-26 DIAGNOSIS — I10 Essential (primary) hypertension: Secondary | ICD-10-CM | POA: Diagnosis not present

## 2017-01-26 DIAGNOSIS — M059 Rheumatoid arthritis with rheumatoid factor, unspecified: Secondary | ICD-10-CM | POA: Diagnosis not present

## 2017-01-27 DIAGNOSIS — N3010 Interstitial cystitis (chronic) without hematuria: Secondary | ICD-10-CM | POA: Diagnosis not present

## 2017-01-27 DIAGNOSIS — R630 Anorexia: Secondary | ICD-10-CM | POA: Diagnosis not present

## 2017-01-27 DIAGNOSIS — N390 Urinary tract infection, site not specified: Secondary | ICD-10-CM | POA: Diagnosis not present

## 2017-01-27 DIAGNOSIS — M059 Rheumatoid arthritis with rheumatoid factor, unspecified: Secondary | ICD-10-CM | POA: Diagnosis not present

## 2017-01-27 DIAGNOSIS — C028 Malignant neoplasm of overlapping sites of tongue: Secondary | ICD-10-CM | POA: Diagnosis not present

## 2017-01-27 DIAGNOSIS — I10 Essential (primary) hypertension: Secondary | ICD-10-CM | POA: Diagnosis not present

## 2017-01-30 DIAGNOSIS — N3010 Interstitial cystitis (chronic) without hematuria: Secondary | ICD-10-CM | POA: Diagnosis not present

## 2017-01-30 DIAGNOSIS — N390 Urinary tract infection, site not specified: Secondary | ICD-10-CM | POA: Diagnosis not present

## 2017-01-30 DIAGNOSIS — M059 Rheumatoid arthritis with rheumatoid factor, unspecified: Secondary | ICD-10-CM | POA: Diagnosis not present

## 2017-01-30 DIAGNOSIS — I10 Essential (primary) hypertension: Secondary | ICD-10-CM | POA: Diagnosis not present

## 2017-01-30 DIAGNOSIS — C028 Malignant neoplasm of overlapping sites of tongue: Secondary | ICD-10-CM | POA: Diagnosis not present

## 2017-01-30 DIAGNOSIS — R630 Anorexia: Secondary | ICD-10-CM | POA: Diagnosis not present

## 2017-02-02 DIAGNOSIS — R630 Anorexia: Secondary | ICD-10-CM | POA: Diagnosis not present

## 2017-02-02 DIAGNOSIS — N3010 Interstitial cystitis (chronic) without hematuria: Secondary | ICD-10-CM | POA: Diagnosis not present

## 2017-02-02 DIAGNOSIS — C028 Malignant neoplasm of overlapping sites of tongue: Secondary | ICD-10-CM | POA: Diagnosis not present

## 2017-02-02 DIAGNOSIS — N390 Urinary tract infection, site not specified: Secondary | ICD-10-CM | POA: Diagnosis not present

## 2017-02-02 DIAGNOSIS — M059 Rheumatoid arthritis with rheumatoid factor, unspecified: Secondary | ICD-10-CM | POA: Diagnosis not present

## 2017-02-02 DIAGNOSIS — I10 Essential (primary) hypertension: Secondary | ICD-10-CM | POA: Diagnosis not present

## 2017-02-05 DIAGNOSIS — I10 Essential (primary) hypertension: Secondary | ICD-10-CM | POA: Diagnosis not present

## 2017-02-05 DIAGNOSIS — R630 Anorexia: Secondary | ICD-10-CM | POA: Diagnosis not present

## 2017-02-05 DIAGNOSIS — C028 Malignant neoplasm of overlapping sites of tongue: Secondary | ICD-10-CM | POA: Diagnosis not present

## 2017-02-05 DIAGNOSIS — N390 Urinary tract infection, site not specified: Secondary | ICD-10-CM | POA: Diagnosis not present

## 2017-02-05 DIAGNOSIS — N3010 Interstitial cystitis (chronic) without hematuria: Secondary | ICD-10-CM | POA: Diagnosis not present

## 2017-02-05 DIAGNOSIS — M059 Rheumatoid arthritis with rheumatoid factor, unspecified: Secondary | ICD-10-CM | POA: Diagnosis not present

## 2017-02-08 DIAGNOSIS — L309 Dermatitis, unspecified: Secondary | ICD-10-CM | POA: Diagnosis not present

## 2017-02-08 DIAGNOSIS — R630 Anorexia: Secondary | ICD-10-CM | POA: Diagnosis not present

## 2017-02-08 DIAGNOSIS — M059 Rheumatoid arthritis with rheumatoid factor, unspecified: Secondary | ICD-10-CM | POA: Diagnosis not present

## 2017-02-08 DIAGNOSIS — C028 Malignant neoplasm of overlapping sites of tongue: Secondary | ICD-10-CM | POA: Diagnosis not present

## 2017-02-08 DIAGNOSIS — K589 Irritable bowel syndrome without diarrhea: Secondary | ICD-10-CM | POA: Diagnosis not present

## 2017-02-08 DIAGNOSIS — I10 Essential (primary) hypertension: Secondary | ICD-10-CM | POA: Diagnosis not present

## 2017-02-08 DIAGNOSIS — K219 Gastro-esophageal reflux disease without esophagitis: Secondary | ICD-10-CM | POA: Diagnosis not present

## 2017-02-08 DIAGNOSIS — N390 Urinary tract infection, site not specified: Secondary | ICD-10-CM | POA: Diagnosis not present

## 2017-02-08 DIAGNOSIS — S3282XA Multiple fractures of pelvis without disruption of pelvic ring, initial encounter for closed fracture: Secondary | ICD-10-CM | POA: Diagnosis not present

## 2017-02-08 DIAGNOSIS — G4089 Other seizures: Secondary | ICD-10-CM | POA: Diagnosis not present

## 2017-02-08 DIAGNOSIS — J301 Allergic rhinitis due to pollen: Secondary | ICD-10-CM | POA: Diagnosis not present

## 2017-02-08 DIAGNOSIS — N3010 Interstitial cystitis (chronic) without hematuria: Secondary | ICD-10-CM | POA: Diagnosis not present

## 2017-02-09 DIAGNOSIS — N390 Urinary tract infection, site not specified: Secondary | ICD-10-CM | POA: Diagnosis not present

## 2017-02-09 DIAGNOSIS — M059 Rheumatoid arthritis with rheumatoid factor, unspecified: Secondary | ICD-10-CM | POA: Diagnosis not present

## 2017-02-09 DIAGNOSIS — C028 Malignant neoplasm of overlapping sites of tongue: Secondary | ICD-10-CM | POA: Diagnosis not present

## 2017-02-09 DIAGNOSIS — N3010 Interstitial cystitis (chronic) without hematuria: Secondary | ICD-10-CM | POA: Diagnosis not present

## 2017-02-09 DIAGNOSIS — R630 Anorexia: Secondary | ICD-10-CM | POA: Diagnosis not present

## 2017-02-09 DIAGNOSIS — I10 Essential (primary) hypertension: Secondary | ICD-10-CM | POA: Diagnosis not present

## 2017-02-10 DIAGNOSIS — M059 Rheumatoid arthritis with rheumatoid factor, unspecified: Secondary | ICD-10-CM | POA: Diagnosis not present

## 2017-02-10 DIAGNOSIS — C028 Malignant neoplasm of overlapping sites of tongue: Secondary | ICD-10-CM | POA: Diagnosis not present

## 2017-02-10 DIAGNOSIS — R630 Anorexia: Secondary | ICD-10-CM | POA: Diagnosis not present

## 2017-02-10 DIAGNOSIS — N3010 Interstitial cystitis (chronic) without hematuria: Secondary | ICD-10-CM | POA: Diagnosis not present

## 2017-02-10 DIAGNOSIS — N390 Urinary tract infection, site not specified: Secondary | ICD-10-CM | POA: Diagnosis not present

## 2017-02-10 DIAGNOSIS — I10 Essential (primary) hypertension: Secondary | ICD-10-CM | POA: Diagnosis not present

## 2017-02-13 DIAGNOSIS — R630 Anorexia: Secondary | ICD-10-CM | POA: Diagnosis not present

## 2017-02-13 DIAGNOSIS — C028 Malignant neoplasm of overlapping sites of tongue: Secondary | ICD-10-CM | POA: Diagnosis not present

## 2017-02-13 DIAGNOSIS — N390 Urinary tract infection, site not specified: Secondary | ICD-10-CM | POA: Diagnosis not present

## 2017-02-13 DIAGNOSIS — I10 Essential (primary) hypertension: Secondary | ICD-10-CM | POA: Diagnosis not present

## 2017-02-13 DIAGNOSIS — M059 Rheumatoid arthritis with rheumatoid factor, unspecified: Secondary | ICD-10-CM | POA: Diagnosis not present

## 2017-02-13 DIAGNOSIS — N3010 Interstitial cystitis (chronic) without hematuria: Secondary | ICD-10-CM | POA: Diagnosis not present

## 2017-02-17 DIAGNOSIS — R630 Anorexia: Secondary | ICD-10-CM | POA: Diagnosis not present

## 2017-02-17 DIAGNOSIS — C028 Malignant neoplasm of overlapping sites of tongue: Secondary | ICD-10-CM | POA: Diagnosis not present

## 2017-02-17 DIAGNOSIS — M059 Rheumatoid arthritis with rheumatoid factor, unspecified: Secondary | ICD-10-CM | POA: Diagnosis not present

## 2017-02-17 DIAGNOSIS — N3010 Interstitial cystitis (chronic) without hematuria: Secondary | ICD-10-CM | POA: Diagnosis not present

## 2017-02-17 DIAGNOSIS — I10 Essential (primary) hypertension: Secondary | ICD-10-CM | POA: Diagnosis not present

## 2017-02-17 DIAGNOSIS — N390 Urinary tract infection, site not specified: Secondary | ICD-10-CM | POA: Diagnosis not present

## 2017-02-20 DIAGNOSIS — M059 Rheumatoid arthritis with rheumatoid factor, unspecified: Secondary | ICD-10-CM | POA: Diagnosis not present

## 2017-02-20 DIAGNOSIS — N390 Urinary tract infection, site not specified: Secondary | ICD-10-CM | POA: Diagnosis not present

## 2017-02-20 DIAGNOSIS — R630 Anorexia: Secondary | ICD-10-CM | POA: Diagnosis not present

## 2017-02-20 DIAGNOSIS — I10 Essential (primary) hypertension: Secondary | ICD-10-CM | POA: Diagnosis not present

## 2017-02-20 DIAGNOSIS — C028 Malignant neoplasm of overlapping sites of tongue: Secondary | ICD-10-CM | POA: Diagnosis not present

## 2017-02-20 DIAGNOSIS — N3010 Interstitial cystitis (chronic) without hematuria: Secondary | ICD-10-CM | POA: Diagnosis not present

## 2017-02-24 DIAGNOSIS — I10 Essential (primary) hypertension: Secondary | ICD-10-CM | POA: Diagnosis not present

## 2017-02-24 DIAGNOSIS — R630 Anorexia: Secondary | ICD-10-CM | POA: Diagnosis not present

## 2017-02-24 DIAGNOSIS — N390 Urinary tract infection, site not specified: Secondary | ICD-10-CM | POA: Diagnosis not present

## 2017-02-24 DIAGNOSIS — C028 Malignant neoplasm of overlapping sites of tongue: Secondary | ICD-10-CM | POA: Diagnosis not present

## 2017-02-24 DIAGNOSIS — M059 Rheumatoid arthritis with rheumatoid factor, unspecified: Secondary | ICD-10-CM | POA: Diagnosis not present

## 2017-02-24 DIAGNOSIS — N3010 Interstitial cystitis (chronic) without hematuria: Secondary | ICD-10-CM | POA: Diagnosis not present

## 2017-02-25 DIAGNOSIS — N3010 Interstitial cystitis (chronic) without hematuria: Secondary | ICD-10-CM | POA: Diagnosis not present

## 2017-02-25 DIAGNOSIS — C028 Malignant neoplasm of overlapping sites of tongue: Secondary | ICD-10-CM | POA: Diagnosis not present

## 2017-02-25 DIAGNOSIS — R630 Anorexia: Secondary | ICD-10-CM | POA: Diagnosis not present

## 2017-02-25 DIAGNOSIS — M059 Rheumatoid arthritis with rheumatoid factor, unspecified: Secondary | ICD-10-CM | POA: Diagnosis not present

## 2017-02-25 DIAGNOSIS — N390 Urinary tract infection, site not specified: Secondary | ICD-10-CM | POA: Diagnosis not present

## 2017-02-25 DIAGNOSIS — I10 Essential (primary) hypertension: Secondary | ICD-10-CM | POA: Diagnosis not present

## 2017-02-26 DIAGNOSIS — I10 Essential (primary) hypertension: Secondary | ICD-10-CM | POA: Diagnosis not present

## 2017-02-26 DIAGNOSIS — C028 Malignant neoplasm of overlapping sites of tongue: Secondary | ICD-10-CM | POA: Diagnosis not present

## 2017-02-26 DIAGNOSIS — M059 Rheumatoid arthritis with rheumatoid factor, unspecified: Secondary | ICD-10-CM | POA: Diagnosis not present

## 2017-02-26 DIAGNOSIS — N3010 Interstitial cystitis (chronic) without hematuria: Secondary | ICD-10-CM | POA: Diagnosis not present

## 2017-02-26 DIAGNOSIS — N390 Urinary tract infection, site not specified: Secondary | ICD-10-CM | POA: Diagnosis not present

## 2017-02-26 DIAGNOSIS — R630 Anorexia: Secondary | ICD-10-CM | POA: Diagnosis not present

## 2017-02-27 DIAGNOSIS — R630 Anorexia: Secondary | ICD-10-CM | POA: Diagnosis not present

## 2017-02-27 DIAGNOSIS — N3010 Interstitial cystitis (chronic) without hematuria: Secondary | ICD-10-CM | POA: Diagnosis not present

## 2017-02-27 DIAGNOSIS — N390 Urinary tract infection, site not specified: Secondary | ICD-10-CM | POA: Diagnosis not present

## 2017-02-27 DIAGNOSIS — M059 Rheumatoid arthritis with rheumatoid factor, unspecified: Secondary | ICD-10-CM | POA: Diagnosis not present

## 2017-02-27 DIAGNOSIS — I10 Essential (primary) hypertension: Secondary | ICD-10-CM | POA: Diagnosis not present

## 2017-02-27 DIAGNOSIS — C028 Malignant neoplasm of overlapping sites of tongue: Secondary | ICD-10-CM | POA: Diagnosis not present

## 2017-03-03 DIAGNOSIS — N390 Urinary tract infection, site not specified: Secondary | ICD-10-CM | POA: Diagnosis not present

## 2017-03-03 DIAGNOSIS — N3010 Interstitial cystitis (chronic) without hematuria: Secondary | ICD-10-CM | POA: Diagnosis not present

## 2017-03-03 DIAGNOSIS — M059 Rheumatoid arthritis with rheumatoid factor, unspecified: Secondary | ICD-10-CM | POA: Diagnosis not present

## 2017-03-03 DIAGNOSIS — C028 Malignant neoplasm of overlapping sites of tongue: Secondary | ICD-10-CM | POA: Diagnosis not present

## 2017-03-03 DIAGNOSIS — R630 Anorexia: Secondary | ICD-10-CM | POA: Diagnosis not present

## 2017-03-03 DIAGNOSIS — I10 Essential (primary) hypertension: Secondary | ICD-10-CM | POA: Diagnosis not present

## 2017-03-04 DIAGNOSIS — M059 Rheumatoid arthritis with rheumatoid factor, unspecified: Secondary | ICD-10-CM | POA: Diagnosis not present

## 2017-03-04 DIAGNOSIS — C028 Malignant neoplasm of overlapping sites of tongue: Secondary | ICD-10-CM | POA: Diagnosis not present

## 2017-03-04 DIAGNOSIS — R630 Anorexia: Secondary | ICD-10-CM | POA: Diagnosis not present

## 2017-03-04 DIAGNOSIS — N3010 Interstitial cystitis (chronic) without hematuria: Secondary | ICD-10-CM | POA: Diagnosis not present

## 2017-03-04 DIAGNOSIS — I10 Essential (primary) hypertension: Secondary | ICD-10-CM | POA: Diagnosis not present

## 2017-03-04 DIAGNOSIS — N390 Urinary tract infection, site not specified: Secondary | ICD-10-CM | POA: Diagnosis not present

## 2017-03-06 DIAGNOSIS — N390 Urinary tract infection, site not specified: Secondary | ICD-10-CM | POA: Diagnosis not present

## 2017-03-06 DIAGNOSIS — I10 Essential (primary) hypertension: Secondary | ICD-10-CM | POA: Diagnosis not present

## 2017-03-06 DIAGNOSIS — M059 Rheumatoid arthritis with rheumatoid factor, unspecified: Secondary | ICD-10-CM | POA: Diagnosis not present

## 2017-03-06 DIAGNOSIS — N3010 Interstitial cystitis (chronic) without hematuria: Secondary | ICD-10-CM | POA: Diagnosis not present

## 2017-03-06 DIAGNOSIS — R630 Anorexia: Secondary | ICD-10-CM | POA: Diagnosis not present

## 2017-03-06 DIAGNOSIS — C028 Malignant neoplasm of overlapping sites of tongue: Secondary | ICD-10-CM | POA: Diagnosis not present

## 2017-03-10 DIAGNOSIS — G4089 Other seizures: Secondary | ICD-10-CM | POA: Diagnosis not present

## 2017-03-10 DIAGNOSIS — K219 Gastro-esophageal reflux disease without esophagitis: Secondary | ICD-10-CM | POA: Diagnosis not present

## 2017-03-10 DIAGNOSIS — J301 Allergic rhinitis due to pollen: Secondary | ICD-10-CM | POA: Diagnosis not present

## 2017-03-10 DIAGNOSIS — N390 Urinary tract infection, site not specified: Secondary | ICD-10-CM | POA: Diagnosis not present

## 2017-03-10 DIAGNOSIS — I10 Essential (primary) hypertension: Secondary | ICD-10-CM | POA: Diagnosis not present

## 2017-03-10 DIAGNOSIS — R630 Anorexia: Secondary | ICD-10-CM | POA: Diagnosis not present

## 2017-03-10 DIAGNOSIS — Z79899 Other long term (current) drug therapy: Secondary | ICD-10-CM | POA: Diagnosis not present

## 2017-03-10 DIAGNOSIS — L309 Dermatitis, unspecified: Secondary | ICD-10-CM | POA: Diagnosis not present

## 2017-03-10 DIAGNOSIS — K589 Irritable bowel syndrome without diarrhea: Secondary | ICD-10-CM | POA: Diagnosis not present

## 2017-03-10 DIAGNOSIS — C028 Malignant neoplasm of overlapping sites of tongue: Secondary | ICD-10-CM | POA: Diagnosis not present

## 2017-03-10 DIAGNOSIS — N3010 Interstitial cystitis (chronic) without hematuria: Secondary | ICD-10-CM | POA: Diagnosis not present

## 2017-03-10 DIAGNOSIS — M059 Rheumatoid arthritis with rheumatoid factor, unspecified: Secondary | ICD-10-CM | POA: Diagnosis not present

## 2017-03-10 DIAGNOSIS — S3282XA Multiple fractures of pelvis without disruption of pelvic ring, initial encounter for closed fracture: Secondary | ICD-10-CM | POA: Diagnosis not present

## 2017-03-13 DIAGNOSIS — C028 Malignant neoplasm of overlapping sites of tongue: Secondary | ICD-10-CM | POA: Diagnosis not present

## 2017-03-13 DIAGNOSIS — I10 Essential (primary) hypertension: Secondary | ICD-10-CM | POA: Diagnosis not present

## 2017-03-13 DIAGNOSIS — N3010 Interstitial cystitis (chronic) without hematuria: Secondary | ICD-10-CM | POA: Diagnosis not present

## 2017-03-13 DIAGNOSIS — M059 Rheumatoid arthritis with rheumatoid factor, unspecified: Secondary | ICD-10-CM | POA: Diagnosis not present

## 2017-03-13 DIAGNOSIS — R630 Anorexia: Secondary | ICD-10-CM | POA: Diagnosis not present

## 2017-03-13 DIAGNOSIS — N390 Urinary tract infection, site not specified: Secondary | ICD-10-CM | POA: Diagnosis not present

## 2017-03-17 DIAGNOSIS — N3010 Interstitial cystitis (chronic) without hematuria: Secondary | ICD-10-CM | POA: Diagnosis not present

## 2017-03-17 DIAGNOSIS — N390 Urinary tract infection, site not specified: Secondary | ICD-10-CM | POA: Diagnosis not present

## 2017-03-17 DIAGNOSIS — I10 Essential (primary) hypertension: Secondary | ICD-10-CM | POA: Diagnosis not present

## 2017-03-17 DIAGNOSIS — C028 Malignant neoplasm of overlapping sites of tongue: Secondary | ICD-10-CM | POA: Diagnosis not present

## 2017-03-17 DIAGNOSIS — R630 Anorexia: Secondary | ICD-10-CM | POA: Diagnosis not present

## 2017-03-17 DIAGNOSIS — M059 Rheumatoid arthritis with rheumatoid factor, unspecified: Secondary | ICD-10-CM | POA: Diagnosis not present

## 2017-03-19 DIAGNOSIS — I10 Essential (primary) hypertension: Secondary | ICD-10-CM | POA: Diagnosis not present

## 2017-03-19 DIAGNOSIS — N390 Urinary tract infection, site not specified: Secondary | ICD-10-CM | POA: Diagnosis not present

## 2017-03-19 DIAGNOSIS — M059 Rheumatoid arthritis with rheumatoid factor, unspecified: Secondary | ICD-10-CM | POA: Diagnosis not present

## 2017-03-19 DIAGNOSIS — C028 Malignant neoplasm of overlapping sites of tongue: Secondary | ICD-10-CM | POA: Diagnosis not present

## 2017-03-19 DIAGNOSIS — N3010 Interstitial cystitis (chronic) without hematuria: Secondary | ICD-10-CM | POA: Diagnosis not present

## 2017-03-19 DIAGNOSIS — R630 Anorexia: Secondary | ICD-10-CM | POA: Diagnosis not present

## 2017-03-20 DIAGNOSIS — C028 Malignant neoplasm of overlapping sites of tongue: Secondary | ICD-10-CM | POA: Diagnosis not present

## 2017-03-20 DIAGNOSIS — N3010 Interstitial cystitis (chronic) without hematuria: Secondary | ICD-10-CM | POA: Diagnosis not present

## 2017-03-20 DIAGNOSIS — M059 Rheumatoid arthritis with rheumatoid factor, unspecified: Secondary | ICD-10-CM | POA: Diagnosis not present

## 2017-03-20 DIAGNOSIS — N390 Urinary tract infection, site not specified: Secondary | ICD-10-CM | POA: Diagnosis not present

## 2017-03-20 DIAGNOSIS — R630 Anorexia: Secondary | ICD-10-CM | POA: Diagnosis not present

## 2017-03-20 DIAGNOSIS — I10 Essential (primary) hypertension: Secondary | ICD-10-CM | POA: Diagnosis not present

## 2017-03-22 DIAGNOSIS — C028 Malignant neoplasm of overlapping sites of tongue: Secondary | ICD-10-CM | POA: Diagnosis not present

## 2017-03-22 DIAGNOSIS — N3010 Interstitial cystitis (chronic) without hematuria: Secondary | ICD-10-CM | POA: Diagnosis not present

## 2017-03-22 DIAGNOSIS — N390 Urinary tract infection, site not specified: Secondary | ICD-10-CM | POA: Diagnosis not present

## 2017-03-22 DIAGNOSIS — R630 Anorexia: Secondary | ICD-10-CM | POA: Diagnosis not present

## 2017-03-22 DIAGNOSIS — M059 Rheumatoid arthritis with rheumatoid factor, unspecified: Secondary | ICD-10-CM | POA: Diagnosis not present

## 2017-03-22 DIAGNOSIS — I10 Essential (primary) hypertension: Secondary | ICD-10-CM | POA: Diagnosis not present

## 2017-03-24 DIAGNOSIS — R630 Anorexia: Secondary | ICD-10-CM | POA: Diagnosis not present

## 2017-03-24 DIAGNOSIS — C028 Malignant neoplasm of overlapping sites of tongue: Secondary | ICD-10-CM | POA: Diagnosis not present

## 2017-03-24 DIAGNOSIS — N390 Urinary tract infection, site not specified: Secondary | ICD-10-CM | POA: Diagnosis not present

## 2017-03-24 DIAGNOSIS — M059 Rheumatoid arthritis with rheumatoid factor, unspecified: Secondary | ICD-10-CM | POA: Diagnosis not present

## 2017-03-24 DIAGNOSIS — N3010 Interstitial cystitis (chronic) without hematuria: Secondary | ICD-10-CM | POA: Diagnosis not present

## 2017-03-24 DIAGNOSIS — I10 Essential (primary) hypertension: Secondary | ICD-10-CM | POA: Diagnosis not present

## 2017-03-26 DIAGNOSIS — I10 Essential (primary) hypertension: Secondary | ICD-10-CM | POA: Diagnosis not present

## 2017-03-26 DIAGNOSIS — M059 Rheumatoid arthritis with rheumatoid factor, unspecified: Secondary | ICD-10-CM | POA: Diagnosis not present

## 2017-03-26 DIAGNOSIS — C028 Malignant neoplasm of overlapping sites of tongue: Secondary | ICD-10-CM | POA: Diagnosis not present

## 2017-03-26 DIAGNOSIS — R630 Anorexia: Secondary | ICD-10-CM | POA: Diagnosis not present

## 2017-03-26 DIAGNOSIS — N3010 Interstitial cystitis (chronic) without hematuria: Secondary | ICD-10-CM | POA: Diagnosis not present

## 2017-03-26 DIAGNOSIS — N390 Urinary tract infection, site not specified: Secondary | ICD-10-CM | POA: Diagnosis not present

## 2017-03-27 DIAGNOSIS — C028 Malignant neoplasm of overlapping sites of tongue: Secondary | ICD-10-CM | POA: Diagnosis not present

## 2017-03-27 DIAGNOSIS — M059 Rheumatoid arthritis with rheumatoid factor, unspecified: Secondary | ICD-10-CM | POA: Diagnosis not present

## 2017-03-27 DIAGNOSIS — N390 Urinary tract infection, site not specified: Secondary | ICD-10-CM | POA: Diagnosis not present

## 2017-03-27 DIAGNOSIS — I10 Essential (primary) hypertension: Secondary | ICD-10-CM | POA: Diagnosis not present

## 2017-03-27 DIAGNOSIS — N3010 Interstitial cystitis (chronic) without hematuria: Secondary | ICD-10-CM | POA: Diagnosis not present

## 2017-03-27 DIAGNOSIS — R630 Anorexia: Secondary | ICD-10-CM | POA: Diagnosis not present

## 2017-03-30 DIAGNOSIS — M059 Rheumatoid arthritis with rheumatoid factor, unspecified: Secondary | ICD-10-CM | POA: Diagnosis not present

## 2017-03-30 DIAGNOSIS — R630 Anorexia: Secondary | ICD-10-CM | POA: Diagnosis not present

## 2017-03-30 DIAGNOSIS — C028 Malignant neoplasm of overlapping sites of tongue: Secondary | ICD-10-CM | POA: Diagnosis not present

## 2017-03-30 DIAGNOSIS — N390 Urinary tract infection, site not specified: Secondary | ICD-10-CM | POA: Diagnosis not present

## 2017-03-30 DIAGNOSIS — I10 Essential (primary) hypertension: Secondary | ICD-10-CM | POA: Diagnosis not present

## 2017-03-30 DIAGNOSIS — N3010 Interstitial cystitis (chronic) without hematuria: Secondary | ICD-10-CM | POA: Diagnosis not present

## 2017-03-31 ENCOUNTER — Ambulatory Visit (INDEPENDENT_AMBULATORY_CARE_PROVIDER_SITE_OTHER): Payer: Medicare Other | Admitting: Orthopedic Surgery

## 2017-03-31 DIAGNOSIS — R630 Anorexia: Secondary | ICD-10-CM | POA: Diagnosis not present

## 2017-03-31 DIAGNOSIS — I10 Essential (primary) hypertension: Secondary | ICD-10-CM | POA: Diagnosis not present

## 2017-03-31 DIAGNOSIS — C028 Malignant neoplasm of overlapping sites of tongue: Secondary | ICD-10-CM | POA: Diagnosis not present

## 2017-03-31 DIAGNOSIS — N3010 Interstitial cystitis (chronic) without hematuria: Secondary | ICD-10-CM | POA: Diagnosis not present

## 2017-03-31 DIAGNOSIS — M059 Rheumatoid arthritis with rheumatoid factor, unspecified: Secondary | ICD-10-CM | POA: Diagnosis not present

## 2017-03-31 DIAGNOSIS — N390 Urinary tract infection, site not specified: Secondary | ICD-10-CM | POA: Diagnosis not present

## 2017-04-01 DIAGNOSIS — M059 Rheumatoid arthritis with rheumatoid factor, unspecified: Secondary | ICD-10-CM | POA: Diagnosis not present

## 2017-04-01 DIAGNOSIS — N3010 Interstitial cystitis (chronic) without hematuria: Secondary | ICD-10-CM | POA: Diagnosis not present

## 2017-04-01 DIAGNOSIS — N390 Urinary tract infection, site not specified: Secondary | ICD-10-CM | POA: Diagnosis not present

## 2017-04-01 DIAGNOSIS — R630 Anorexia: Secondary | ICD-10-CM | POA: Diagnosis not present

## 2017-04-01 DIAGNOSIS — I10 Essential (primary) hypertension: Secondary | ICD-10-CM | POA: Diagnosis not present

## 2017-04-01 DIAGNOSIS — C028 Malignant neoplasm of overlapping sites of tongue: Secondary | ICD-10-CM | POA: Diagnosis not present

## 2017-04-02 DIAGNOSIS — R630 Anorexia: Secondary | ICD-10-CM | POA: Diagnosis not present

## 2017-04-02 DIAGNOSIS — C028 Malignant neoplasm of overlapping sites of tongue: Secondary | ICD-10-CM | POA: Diagnosis not present

## 2017-04-02 DIAGNOSIS — M059 Rheumatoid arthritis with rheumatoid factor, unspecified: Secondary | ICD-10-CM | POA: Diagnosis not present

## 2017-04-02 DIAGNOSIS — N390 Urinary tract infection, site not specified: Secondary | ICD-10-CM | POA: Diagnosis not present

## 2017-04-02 DIAGNOSIS — I10 Essential (primary) hypertension: Secondary | ICD-10-CM | POA: Diagnosis not present

## 2017-04-02 DIAGNOSIS — N3010 Interstitial cystitis (chronic) without hematuria: Secondary | ICD-10-CM | POA: Diagnosis not present

## 2017-04-07 DIAGNOSIS — N390 Urinary tract infection, site not specified: Secondary | ICD-10-CM | POA: Diagnosis not present

## 2017-04-07 DIAGNOSIS — I10 Essential (primary) hypertension: Secondary | ICD-10-CM | POA: Diagnosis not present

## 2017-04-07 DIAGNOSIS — R630 Anorexia: Secondary | ICD-10-CM | POA: Diagnosis not present

## 2017-04-07 DIAGNOSIS — N3010 Interstitial cystitis (chronic) without hematuria: Secondary | ICD-10-CM | POA: Diagnosis not present

## 2017-04-07 DIAGNOSIS — C028 Malignant neoplasm of overlapping sites of tongue: Secondary | ICD-10-CM | POA: Diagnosis not present

## 2017-04-07 DIAGNOSIS — M059 Rheumatoid arthritis with rheumatoid factor, unspecified: Secondary | ICD-10-CM | POA: Diagnosis not present

## 2017-04-10 DIAGNOSIS — S3282XA Multiple fractures of pelvis without disruption of pelvic ring, initial encounter for closed fracture: Secondary | ICD-10-CM | POA: Diagnosis not present

## 2017-04-10 DIAGNOSIS — L309 Dermatitis, unspecified: Secondary | ICD-10-CM | POA: Diagnosis not present

## 2017-04-10 DIAGNOSIS — N390 Urinary tract infection, site not specified: Secondary | ICD-10-CM | POA: Diagnosis not present

## 2017-04-10 DIAGNOSIS — C028 Malignant neoplasm of overlapping sites of tongue: Secondary | ICD-10-CM | POA: Diagnosis not present

## 2017-04-10 DIAGNOSIS — R630 Anorexia: Secondary | ICD-10-CM | POA: Diagnosis not present

## 2017-04-10 DIAGNOSIS — K219 Gastro-esophageal reflux disease without esophagitis: Secondary | ICD-10-CM | POA: Diagnosis not present

## 2017-04-10 DIAGNOSIS — J301 Allergic rhinitis due to pollen: Secondary | ICD-10-CM | POA: Diagnosis not present

## 2017-04-10 DIAGNOSIS — N3010 Interstitial cystitis (chronic) without hematuria: Secondary | ICD-10-CM | POA: Diagnosis not present

## 2017-04-10 DIAGNOSIS — M059 Rheumatoid arthritis with rheumatoid factor, unspecified: Secondary | ICD-10-CM | POA: Diagnosis not present

## 2017-04-10 DIAGNOSIS — I10 Essential (primary) hypertension: Secondary | ICD-10-CM | POA: Diagnosis not present

## 2017-04-10 DIAGNOSIS — K589 Irritable bowel syndrome without diarrhea: Secondary | ICD-10-CM | POA: Diagnosis not present

## 2017-04-10 DIAGNOSIS — G4089 Other seizures: Secondary | ICD-10-CM | POA: Diagnosis not present

## 2017-04-11 DIAGNOSIS — I10 Essential (primary) hypertension: Secondary | ICD-10-CM | POA: Diagnosis not present

## 2017-04-11 DIAGNOSIS — R630 Anorexia: Secondary | ICD-10-CM | POA: Diagnosis not present

## 2017-04-11 DIAGNOSIS — N3010 Interstitial cystitis (chronic) without hematuria: Secondary | ICD-10-CM | POA: Diagnosis not present

## 2017-04-11 DIAGNOSIS — M059 Rheumatoid arthritis with rheumatoid factor, unspecified: Secondary | ICD-10-CM | POA: Diagnosis not present

## 2017-04-11 DIAGNOSIS — C028 Malignant neoplasm of overlapping sites of tongue: Secondary | ICD-10-CM | POA: Diagnosis not present

## 2017-04-11 DIAGNOSIS — N390 Urinary tract infection, site not specified: Secondary | ICD-10-CM | POA: Diagnosis not present

## 2017-04-14 DIAGNOSIS — I10 Essential (primary) hypertension: Secondary | ICD-10-CM | POA: Diagnosis not present

## 2017-04-14 DIAGNOSIS — M059 Rheumatoid arthritis with rheumatoid factor, unspecified: Secondary | ICD-10-CM | POA: Diagnosis not present

## 2017-04-14 DIAGNOSIS — N390 Urinary tract infection, site not specified: Secondary | ICD-10-CM | POA: Diagnosis not present

## 2017-04-14 DIAGNOSIS — C028 Malignant neoplasm of overlapping sites of tongue: Secondary | ICD-10-CM | POA: Diagnosis not present

## 2017-04-14 DIAGNOSIS — N3010 Interstitial cystitis (chronic) without hematuria: Secondary | ICD-10-CM | POA: Diagnosis not present

## 2017-04-14 DIAGNOSIS — R630 Anorexia: Secondary | ICD-10-CM | POA: Diagnosis not present

## 2017-04-16 DIAGNOSIS — N390 Urinary tract infection, site not specified: Secondary | ICD-10-CM | POA: Diagnosis not present

## 2017-04-16 DIAGNOSIS — R630 Anorexia: Secondary | ICD-10-CM | POA: Diagnosis not present

## 2017-04-16 DIAGNOSIS — I10 Essential (primary) hypertension: Secondary | ICD-10-CM | POA: Diagnosis not present

## 2017-04-16 DIAGNOSIS — M059 Rheumatoid arthritis with rheumatoid factor, unspecified: Secondary | ICD-10-CM | POA: Diagnosis not present

## 2017-04-16 DIAGNOSIS — N3010 Interstitial cystitis (chronic) without hematuria: Secondary | ICD-10-CM | POA: Diagnosis not present

## 2017-04-16 DIAGNOSIS — C028 Malignant neoplasm of overlapping sites of tongue: Secondary | ICD-10-CM | POA: Diagnosis not present

## 2017-04-17 DIAGNOSIS — M059 Rheumatoid arthritis with rheumatoid factor, unspecified: Secondary | ICD-10-CM | POA: Diagnosis not present

## 2017-04-17 DIAGNOSIS — N3010 Interstitial cystitis (chronic) without hematuria: Secondary | ICD-10-CM | POA: Diagnosis not present

## 2017-04-17 DIAGNOSIS — I10 Essential (primary) hypertension: Secondary | ICD-10-CM | POA: Diagnosis not present

## 2017-04-17 DIAGNOSIS — N390 Urinary tract infection, site not specified: Secondary | ICD-10-CM | POA: Diagnosis not present

## 2017-04-17 DIAGNOSIS — C028 Malignant neoplasm of overlapping sites of tongue: Secondary | ICD-10-CM | POA: Diagnosis not present

## 2017-04-17 DIAGNOSIS — R630 Anorexia: Secondary | ICD-10-CM | POA: Diagnosis not present

## 2017-04-21 DIAGNOSIS — N3010 Interstitial cystitis (chronic) without hematuria: Secondary | ICD-10-CM | POA: Diagnosis not present

## 2017-04-21 DIAGNOSIS — N390 Urinary tract infection, site not specified: Secondary | ICD-10-CM | POA: Diagnosis not present

## 2017-04-21 DIAGNOSIS — M059 Rheumatoid arthritis with rheumatoid factor, unspecified: Secondary | ICD-10-CM | POA: Diagnosis not present

## 2017-04-21 DIAGNOSIS — I10 Essential (primary) hypertension: Secondary | ICD-10-CM | POA: Diagnosis not present

## 2017-04-21 DIAGNOSIS — C028 Malignant neoplasm of overlapping sites of tongue: Secondary | ICD-10-CM | POA: Diagnosis not present

## 2017-04-21 DIAGNOSIS — R630 Anorexia: Secondary | ICD-10-CM | POA: Diagnosis not present

## 2017-04-24 DIAGNOSIS — N390 Urinary tract infection, site not specified: Secondary | ICD-10-CM | POA: Diagnosis not present

## 2017-04-24 DIAGNOSIS — R630 Anorexia: Secondary | ICD-10-CM | POA: Diagnosis not present

## 2017-04-24 DIAGNOSIS — N3010 Interstitial cystitis (chronic) without hematuria: Secondary | ICD-10-CM | POA: Diagnosis not present

## 2017-04-24 DIAGNOSIS — C028 Malignant neoplasm of overlapping sites of tongue: Secondary | ICD-10-CM | POA: Diagnosis not present

## 2017-04-24 DIAGNOSIS — M059 Rheumatoid arthritis with rheumatoid factor, unspecified: Secondary | ICD-10-CM | POA: Diagnosis not present

## 2017-04-24 DIAGNOSIS — I10 Essential (primary) hypertension: Secondary | ICD-10-CM | POA: Diagnosis not present

## 2017-04-28 DIAGNOSIS — R630 Anorexia: Secondary | ICD-10-CM | POA: Diagnosis not present

## 2017-04-28 DIAGNOSIS — N3010 Interstitial cystitis (chronic) without hematuria: Secondary | ICD-10-CM | POA: Diagnosis not present

## 2017-04-28 DIAGNOSIS — N390 Urinary tract infection, site not specified: Secondary | ICD-10-CM | POA: Diagnosis not present

## 2017-04-28 DIAGNOSIS — C028 Malignant neoplasm of overlapping sites of tongue: Secondary | ICD-10-CM | POA: Diagnosis not present

## 2017-04-28 DIAGNOSIS — I10 Essential (primary) hypertension: Secondary | ICD-10-CM | POA: Diagnosis not present

## 2017-04-28 DIAGNOSIS — M059 Rheumatoid arthritis with rheumatoid factor, unspecified: Secondary | ICD-10-CM | POA: Diagnosis not present

## 2017-04-30 DIAGNOSIS — N3010 Interstitial cystitis (chronic) without hematuria: Secondary | ICD-10-CM | POA: Diagnosis not present

## 2017-04-30 DIAGNOSIS — N390 Urinary tract infection, site not specified: Secondary | ICD-10-CM | POA: Diagnosis not present

## 2017-04-30 DIAGNOSIS — R630 Anorexia: Secondary | ICD-10-CM | POA: Diagnosis not present

## 2017-04-30 DIAGNOSIS — I10 Essential (primary) hypertension: Secondary | ICD-10-CM | POA: Diagnosis not present

## 2017-04-30 DIAGNOSIS — C028 Malignant neoplasm of overlapping sites of tongue: Secondary | ICD-10-CM | POA: Diagnosis not present

## 2017-04-30 DIAGNOSIS — M059 Rheumatoid arthritis with rheumatoid factor, unspecified: Secondary | ICD-10-CM | POA: Diagnosis not present

## 2017-05-01 DIAGNOSIS — M059 Rheumatoid arthritis with rheumatoid factor, unspecified: Secondary | ICD-10-CM | POA: Diagnosis not present

## 2017-05-01 DIAGNOSIS — R630 Anorexia: Secondary | ICD-10-CM | POA: Diagnosis not present

## 2017-05-01 DIAGNOSIS — C028 Malignant neoplasm of overlapping sites of tongue: Secondary | ICD-10-CM | POA: Diagnosis not present

## 2017-05-01 DIAGNOSIS — N390 Urinary tract infection, site not specified: Secondary | ICD-10-CM | POA: Diagnosis not present

## 2017-05-01 DIAGNOSIS — N3010 Interstitial cystitis (chronic) without hematuria: Secondary | ICD-10-CM | POA: Diagnosis not present

## 2017-05-01 DIAGNOSIS — I10 Essential (primary) hypertension: Secondary | ICD-10-CM | POA: Diagnosis not present

## 2017-05-02 DIAGNOSIS — C028 Malignant neoplasm of overlapping sites of tongue: Secondary | ICD-10-CM | POA: Diagnosis not present

## 2017-05-02 DIAGNOSIS — R630 Anorexia: Secondary | ICD-10-CM | POA: Diagnosis not present

## 2017-05-02 DIAGNOSIS — M059 Rheumatoid arthritis with rheumatoid factor, unspecified: Secondary | ICD-10-CM | POA: Diagnosis not present

## 2017-05-02 DIAGNOSIS — N3010 Interstitial cystitis (chronic) without hematuria: Secondary | ICD-10-CM | POA: Diagnosis not present

## 2017-05-02 DIAGNOSIS — N390 Urinary tract infection, site not specified: Secondary | ICD-10-CM | POA: Diagnosis not present

## 2017-05-02 DIAGNOSIS — I10 Essential (primary) hypertension: Secondary | ICD-10-CM | POA: Diagnosis not present

## 2017-05-04 DIAGNOSIS — C028 Malignant neoplasm of overlapping sites of tongue: Secondary | ICD-10-CM | POA: Diagnosis not present

## 2017-05-04 DIAGNOSIS — N3010 Interstitial cystitis (chronic) without hematuria: Secondary | ICD-10-CM | POA: Diagnosis not present

## 2017-05-04 DIAGNOSIS — N390 Urinary tract infection, site not specified: Secondary | ICD-10-CM | POA: Diagnosis not present

## 2017-05-04 DIAGNOSIS — R630 Anorexia: Secondary | ICD-10-CM | POA: Diagnosis not present

## 2017-05-04 DIAGNOSIS — M059 Rheumatoid arthritis with rheumatoid factor, unspecified: Secondary | ICD-10-CM | POA: Diagnosis not present

## 2017-05-04 DIAGNOSIS — I10 Essential (primary) hypertension: Secondary | ICD-10-CM | POA: Diagnosis not present

## 2017-05-05 DIAGNOSIS — N390 Urinary tract infection, site not specified: Secondary | ICD-10-CM | POA: Diagnosis not present

## 2017-05-05 DIAGNOSIS — R630 Anorexia: Secondary | ICD-10-CM | POA: Diagnosis not present

## 2017-05-05 DIAGNOSIS — I10 Essential (primary) hypertension: Secondary | ICD-10-CM | POA: Diagnosis not present

## 2017-05-05 DIAGNOSIS — C028 Malignant neoplasm of overlapping sites of tongue: Secondary | ICD-10-CM | POA: Diagnosis not present

## 2017-05-05 DIAGNOSIS — N3010 Interstitial cystitis (chronic) without hematuria: Secondary | ICD-10-CM | POA: Diagnosis not present

## 2017-05-05 DIAGNOSIS — M059 Rheumatoid arthritis with rheumatoid factor, unspecified: Secondary | ICD-10-CM | POA: Diagnosis not present

## 2017-05-07 DIAGNOSIS — N3010 Interstitial cystitis (chronic) without hematuria: Secondary | ICD-10-CM | POA: Diagnosis not present

## 2017-05-07 DIAGNOSIS — M059 Rheumatoid arthritis with rheumatoid factor, unspecified: Secondary | ICD-10-CM | POA: Diagnosis not present

## 2017-05-07 DIAGNOSIS — C028 Malignant neoplasm of overlapping sites of tongue: Secondary | ICD-10-CM | POA: Diagnosis not present

## 2017-05-07 DIAGNOSIS — N390 Urinary tract infection, site not specified: Secondary | ICD-10-CM | POA: Diagnosis not present

## 2017-05-07 DIAGNOSIS — I10 Essential (primary) hypertension: Secondary | ICD-10-CM | POA: Diagnosis not present

## 2017-05-07 DIAGNOSIS — R630 Anorexia: Secondary | ICD-10-CM | POA: Diagnosis not present

## 2017-05-08 ENCOUNTER — Other Ambulatory Visit (HOSPITAL_COMMUNITY)
Admission: RE | Admit: 2017-05-08 | Discharge: 2017-05-08 | Disposition: A | Discharge status: Home / Self Care | Source: Other Acute Inpatient Hospital | Attending: Geriatric Medicine | Admitting: Geriatric Medicine

## 2017-05-08 DIAGNOSIS — M059 Rheumatoid arthritis with rheumatoid factor, unspecified: Secondary | ICD-10-CM | POA: Diagnosis not present

## 2017-05-08 DIAGNOSIS — N390 Urinary tract infection, site not specified: Secondary | ICD-10-CM | POA: Diagnosis not present

## 2017-05-08 DIAGNOSIS — C028 Malignant neoplasm of overlapping sites of tongue: Secondary | ICD-10-CM | POA: Diagnosis not present

## 2017-05-08 DIAGNOSIS — R630 Anorexia: Secondary | ICD-10-CM | POA: Diagnosis not present

## 2017-05-08 DIAGNOSIS — I10 Essential (primary) hypertension: Secondary | ICD-10-CM | POA: Diagnosis not present

## 2017-05-08 DIAGNOSIS — N3010 Interstitial cystitis (chronic) without hematuria: Secondary | ICD-10-CM | POA: Diagnosis not present

## 2017-05-08 LAB — URINALYSIS, ROUTINE W REFLEX MICROSCOPIC
Bilirubin Urine: NEGATIVE
GLUCOSE, UA: NEGATIVE mg/dL
Ketones, ur: NEGATIVE mg/dL
Nitrite: NEGATIVE
Protein, ur: NEGATIVE mg/dL
SPECIFIC GRAVITY, URINE: 1.01 (ref 1.005–1.030)
pH: 5 (ref 5.0–8.0)

## 2017-05-09 DIAGNOSIS — N3010 Interstitial cystitis (chronic) without hematuria: Secondary | ICD-10-CM | POA: Diagnosis not present

## 2017-05-09 DIAGNOSIS — N390 Urinary tract infection, site not specified: Secondary | ICD-10-CM | POA: Diagnosis not present

## 2017-05-09 DIAGNOSIS — I10 Essential (primary) hypertension: Secondary | ICD-10-CM | POA: Diagnosis not present

## 2017-05-09 DIAGNOSIS — C028 Malignant neoplasm of overlapping sites of tongue: Secondary | ICD-10-CM | POA: Diagnosis not present

## 2017-05-09 DIAGNOSIS — R630 Anorexia: Secondary | ICD-10-CM | POA: Diagnosis not present

## 2017-05-09 DIAGNOSIS — M059 Rheumatoid arthritis with rheumatoid factor, unspecified: Secondary | ICD-10-CM | POA: Diagnosis not present

## 2017-05-10 DIAGNOSIS — N3010 Interstitial cystitis (chronic) without hematuria: Secondary | ICD-10-CM | POA: Diagnosis not present

## 2017-05-10 DIAGNOSIS — R630 Anorexia: Secondary | ICD-10-CM | POA: Diagnosis not present

## 2017-05-10 DIAGNOSIS — I10 Essential (primary) hypertension: Secondary | ICD-10-CM | POA: Diagnosis not present

## 2017-05-10 DIAGNOSIS — S3282XA Multiple fractures of pelvis without disruption of pelvic ring, initial encounter for closed fracture: Secondary | ICD-10-CM | POA: Diagnosis not present

## 2017-05-10 DIAGNOSIS — J301 Allergic rhinitis due to pollen: Secondary | ICD-10-CM | POA: Diagnosis not present

## 2017-05-10 DIAGNOSIS — K219 Gastro-esophageal reflux disease without esophagitis: Secondary | ICD-10-CM | POA: Diagnosis not present

## 2017-05-10 DIAGNOSIS — N390 Urinary tract infection, site not specified: Secondary | ICD-10-CM | POA: Diagnosis not present

## 2017-05-10 DIAGNOSIS — L309 Dermatitis, unspecified: Secondary | ICD-10-CM | POA: Diagnosis not present

## 2017-05-10 DIAGNOSIS — K589 Irritable bowel syndrome without diarrhea: Secondary | ICD-10-CM | POA: Diagnosis not present

## 2017-05-10 DIAGNOSIS — G4089 Other seizures: Secondary | ICD-10-CM | POA: Diagnosis not present

## 2017-05-10 DIAGNOSIS — M059 Rheumatoid arthritis with rheumatoid factor, unspecified: Secondary | ICD-10-CM | POA: Diagnosis not present

## 2017-05-10 DIAGNOSIS — C028 Malignant neoplasm of overlapping sites of tongue: Secondary | ICD-10-CM | POA: Diagnosis not present

## 2017-05-11 DIAGNOSIS — M059 Rheumatoid arthritis with rheumatoid factor, unspecified: Secondary | ICD-10-CM | POA: Diagnosis not present

## 2017-05-11 DIAGNOSIS — R630 Anorexia: Secondary | ICD-10-CM | POA: Diagnosis not present

## 2017-05-11 DIAGNOSIS — N390 Urinary tract infection, site not specified: Secondary | ICD-10-CM | POA: Diagnosis not present

## 2017-05-11 DIAGNOSIS — C028 Malignant neoplasm of overlapping sites of tongue: Secondary | ICD-10-CM | POA: Diagnosis not present

## 2017-05-11 DIAGNOSIS — I10 Essential (primary) hypertension: Secondary | ICD-10-CM | POA: Diagnosis not present

## 2017-05-11 DIAGNOSIS — N3010 Interstitial cystitis (chronic) without hematuria: Secondary | ICD-10-CM | POA: Diagnosis not present

## 2017-05-11 LAB — URINE CULTURE

## 2017-06-10 DEATH — deceased

## 2018-02-21 IMAGING — CR DG CHEST 2V
2 series · 2 of 2 positions shown · non-contrast
Comparison: Chest radiographs dating back through 04/09/2011

CLINICAL DATA: Intermittent productive cough x1 year bold

EXAM:
CHEST  2 VIEW

[w chest pa]
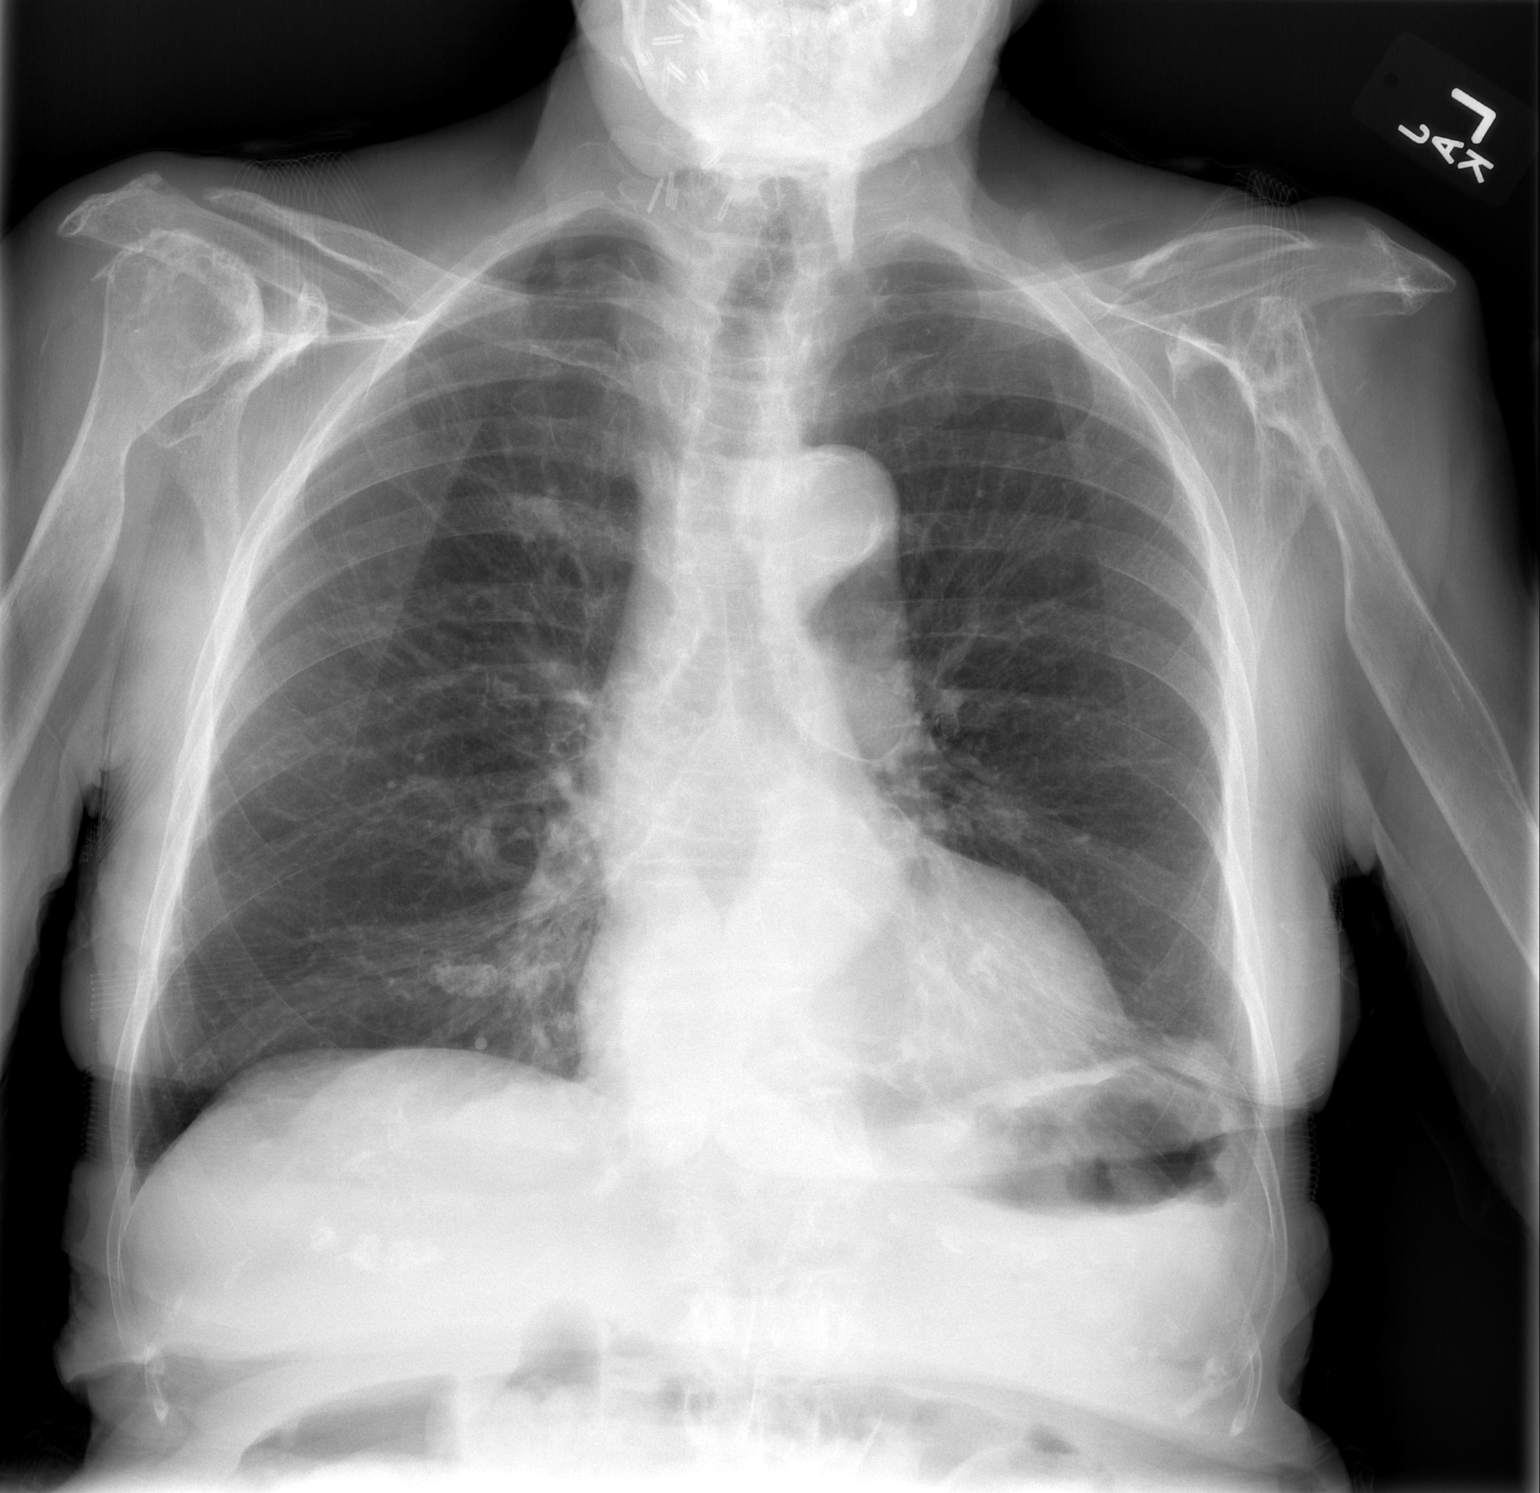

[w chest lat]
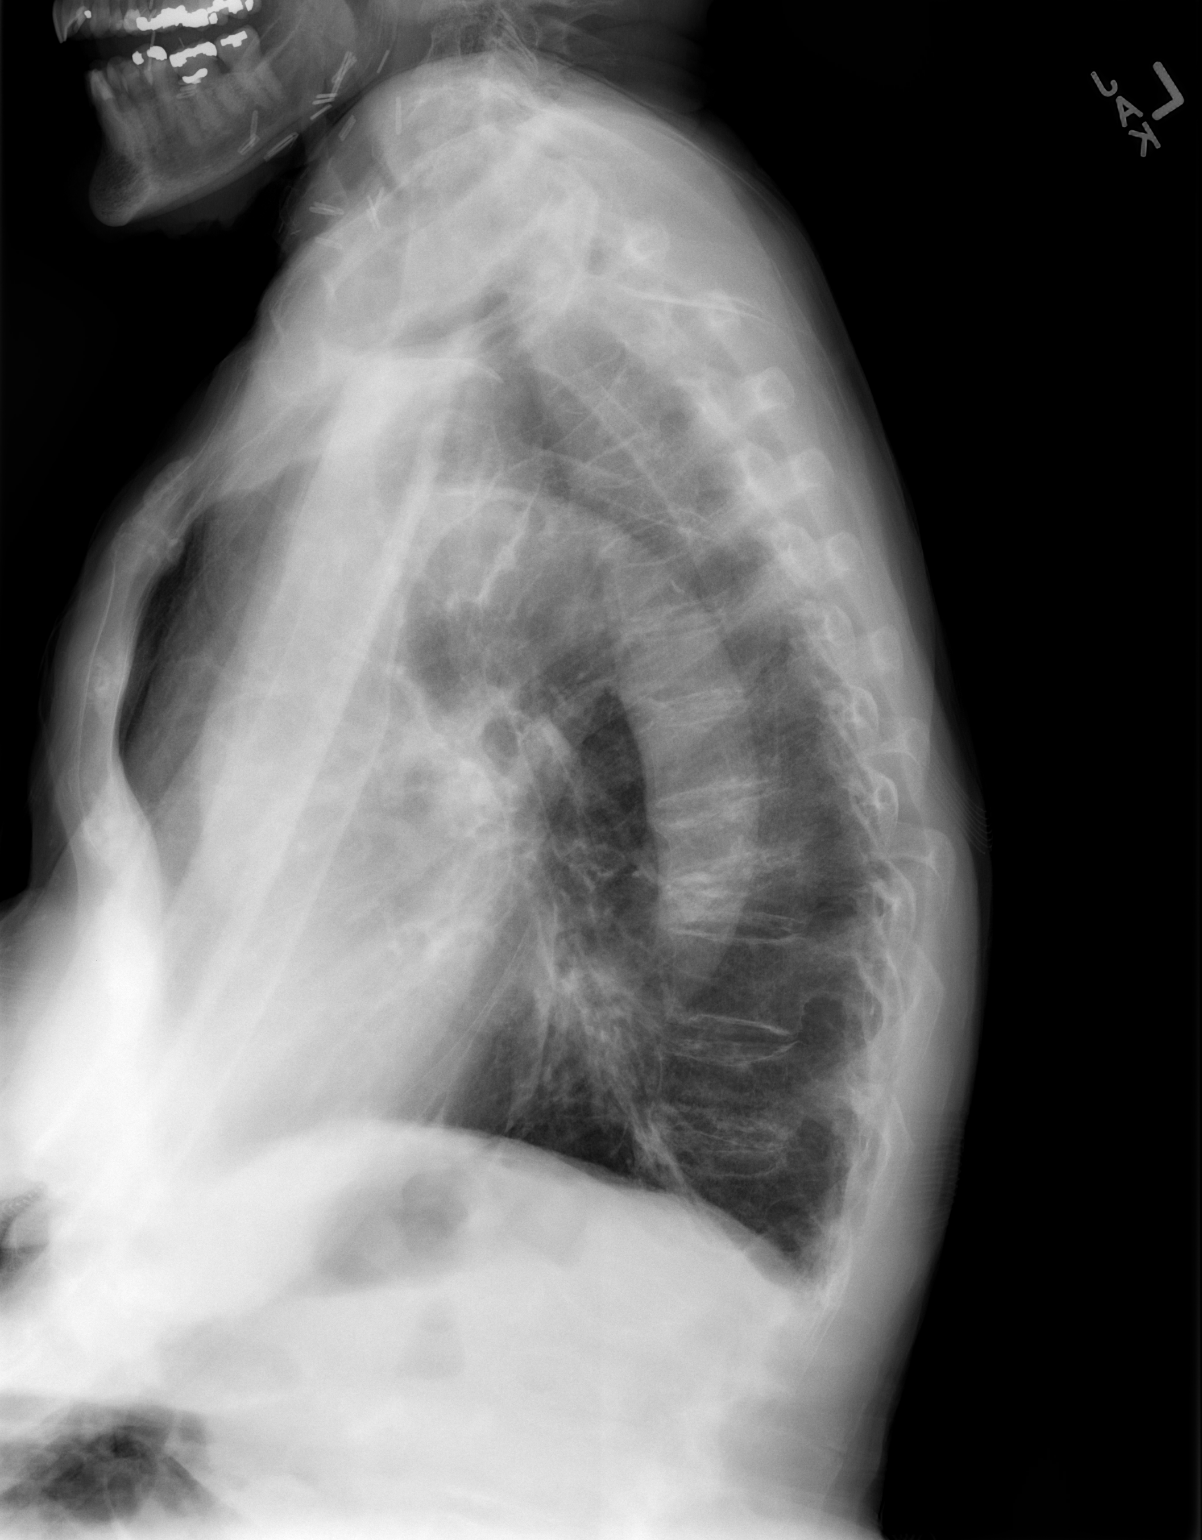

[2 of 2 positions shown; findings below may reference images not displayed]

FINDINGS: The cardiac silhouette is normal in size. The thoracic aorta is
atherosclerotic and tortuous in appearance without aneurysm. The
lungs are hyperinflated without pneumonic consolidations. Small
focal opacity in the right upper lobe may represent a summation of
overlapping ribs, cartilage and upper lobe vessels. Small pulmonary
consolidation believed less likely. Similar findings are seen along
the anterior aspect of several lower ribs on the right. No effusion
or CHF. No pneumothorax.

Tapering of both distal clavicles with extra-articular erosive
changes of both proximal humeri are again seen with marked erosive
changes of the left humeral head and to a lesser degree the right
humeral head are consistent with inflammatory arthritis more
specifically rheumatoid arthritis. Humeral heads are high riding
which may indicate chronic rotator cuff tears. Vascular clips are
seen along the right side of the back as before.
IMPRESSION: Emphysematous hyperinflation of the lungs without acute pulmonary
disease. Probable summation of overlapping rib cartilage and
pulmonary vessels in the right upper lobe as similar findings are
noted in the right lower lobe.

Tapering of both distal clavicles with high-riding humeral heads
consistent with rheumatoid arthritis. Marked deformity of the
glenohumeral joints in particular the left humeral head with erosive
changes of the proximal humeri would also be in keeping with
inflammatory arthritis.

## 2018-05-15 IMAGING — CT CT PARANASAL SINUSES LIMITED
1 of 2 series · 7 of 10 positions shown, 9 images · non-contrast
Comparison: None.

CLINICAL DATA: Cough, right maxillary sinus pressure

EXAM:
CT PARANASAL SINUS LIMITED WITHOUT CONTRAST
TECHNIQUE: Non-contiguous multidetector CT images of the paranasal sinuses were
obtained in a single plane without contrast.

[Series 4: limited sinus st · axial · 0.24mm/px · z∈[+73,+133]mm · 7 of 9 slices shown, 9 images]
[im 2/9  brain]
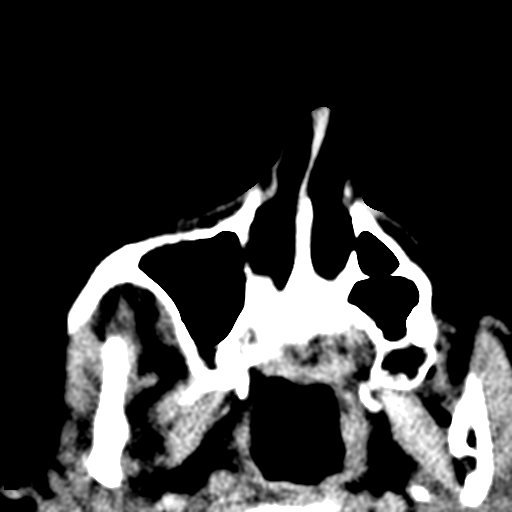
[im 2/9  bone]
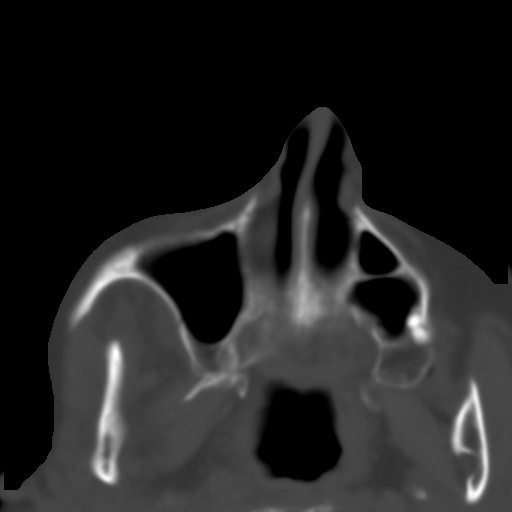
[im 3/9  bone]
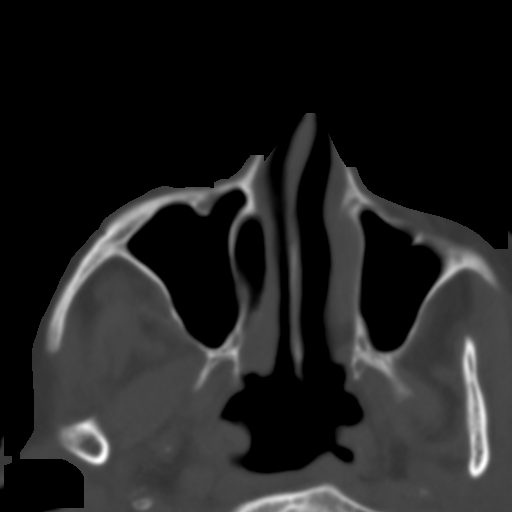
[im 4/9  bone]
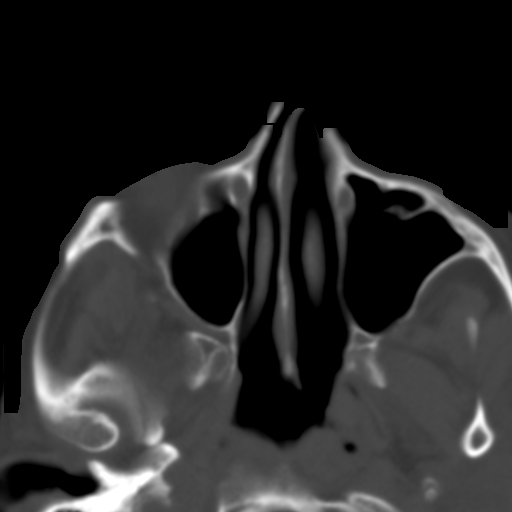
[im 5/9  bone]
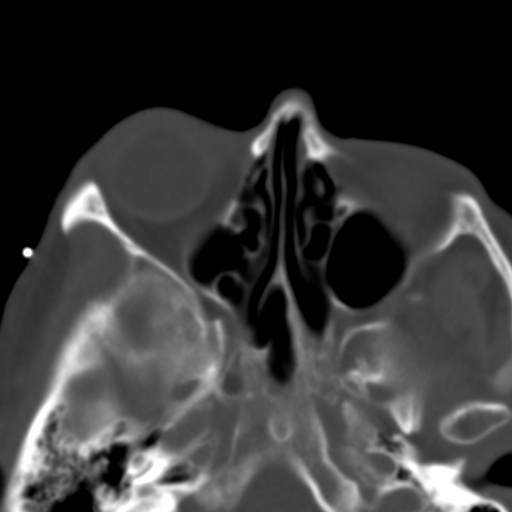
[im 6/9  brain]
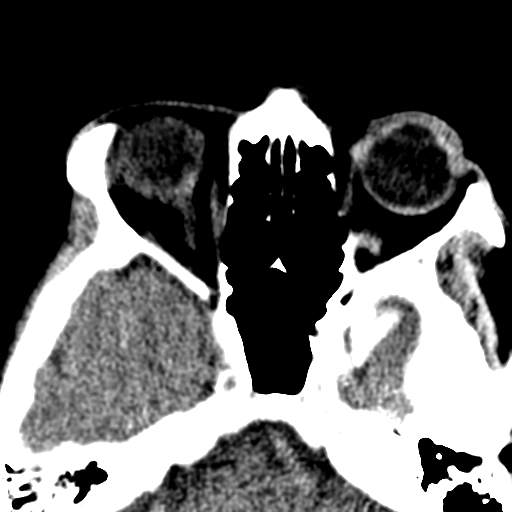
[im 6/9  bone]
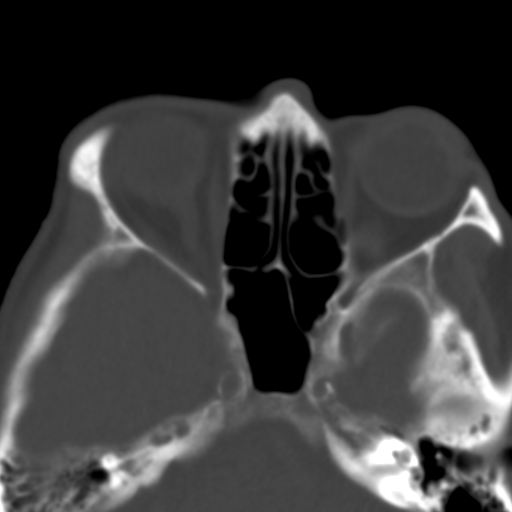
[im 7/9  bone]
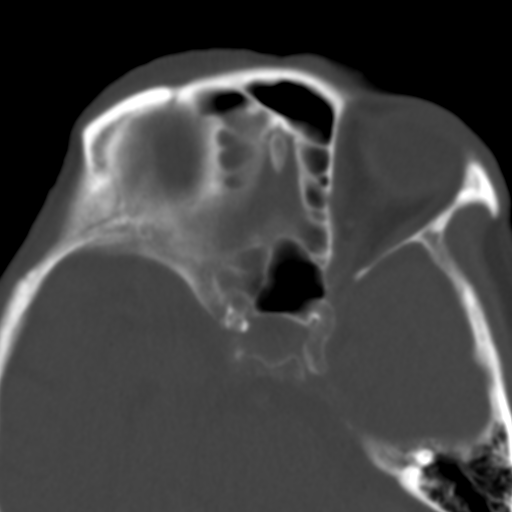
[im 8/9  bone]
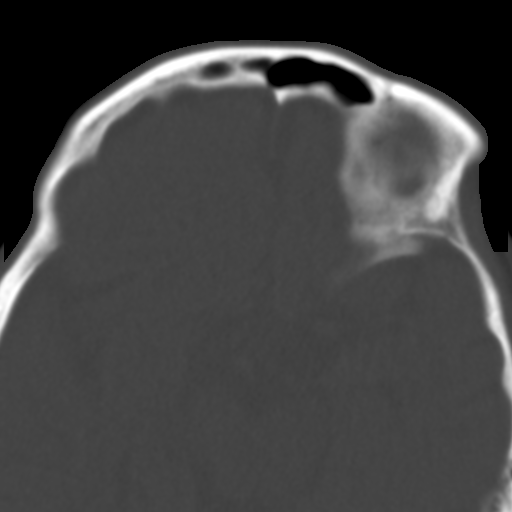

[7 of 10 positions shown; findings below may reference images not displayed]

FINDINGS: The paranasal sinuses appear well pneumatized. No mucosal edema or
air-fluid level is seen. The nasal turbinates are normal in size and
position and the nasal airway is patent. No bony abnormality is
noted. The intracranial portion of the study shows no significant
abnormality. No soft tissue lesion is evident.
IMPRESSION: No evidence of sinusitis.
